# Patient Record
Sex: Male | Born: 1935 | Race: White | Hispanic: No | Marital: Married | Smoking: Never smoker
Health system: Southern US, Community
[De-identification: ages and names within clinical notes are randomized; demographics above are authoritative.]

## PROBLEM LIST (undated history)

## (undated) DIAGNOSIS — I219 Acute myocardial infarction, unspecified: Secondary | ICD-10-CM

## (undated) DIAGNOSIS — F01518 Vascular dementia, unspecified severity, with other behavioral disturbance: Secondary | ICD-10-CM

## (undated) DIAGNOSIS — I10 Essential (primary) hypertension: Secondary | ICD-10-CM

## (undated) DIAGNOSIS — E785 Hyperlipidemia, unspecified: Secondary | ICD-10-CM

## (undated) DIAGNOSIS — I2510 Atherosclerotic heart disease of native coronary artery without angina pectoris: Secondary | ICD-10-CM

## (undated) DIAGNOSIS — F0151 Vascular dementia with behavioral disturbance: Secondary | ICD-10-CM

## (undated) HISTORY — DX: Atherosclerotic heart disease of native coronary artery without angina pectoris: I25.10

## (undated) HISTORY — PX: OTHER SURGICAL HISTORY: SHX169

## (undated) HISTORY — DX: Essential (primary) hypertension: I10

## (undated) HISTORY — DX: Vascular dementia, unspecified severity, with other behavioral disturbance: F01.518

## (undated) HISTORY — DX: Vascular dementia with behavioral disturbance: F01.51

## (undated) HISTORY — DX: Hyperlipidemia, unspecified: E78.5

## (undated) HISTORY — DX: Acute myocardial infarction, unspecified: I21.9

## (undated) HISTORY — PX: CORONARY ARTERY BYPASS GRAFT: SHX141

---

## 1987-06-11 DIAGNOSIS — I219 Acute myocardial infarction, unspecified: Secondary | ICD-10-CM

## 1987-06-11 HISTORY — DX: Acute myocardial infarction, unspecified: I21.9

## 1988-06-10 HISTORY — PX: CARDIAC CATHETERIZATION: SHX172

## 1989-06-10 HISTORY — PX: CORONARY ARTERY BYPASS GRAFT: SHX141

## 2007-06-11 HISTORY — PX: CORONARY ANGIOPLASTY: SHX604

## 2010-12-31 ENCOUNTER — Ambulatory Visit: Payer: Self-pay | Admitting: Cardiovascular Disease

## 2011-01-11 ENCOUNTER — Ambulatory Visit: Payer: Self-pay | Admitting: Cardiovascular Disease

## 2011-01-11 ENCOUNTER — Encounter: Payer: Self-pay | Admitting: Cardiovascular Disease

## 2011-01-11 ENCOUNTER — Ambulatory Visit (INDEPENDENT_AMBULATORY_CARE_PROVIDER_SITE_OTHER): Payer: Medicare Other | Admitting: Cardiovascular Disease

## 2011-01-11 DIAGNOSIS — Z951 Presence of aortocoronary bypass graft: Secondary | ICD-10-CM

## 2011-01-11 DIAGNOSIS — F0390 Unspecified dementia without behavioral disturbance: Secondary | ICD-10-CM | POA: Insufficient documentation

## 2011-01-11 DIAGNOSIS — E119 Type 2 diabetes mellitus without complications: Secondary | ICD-10-CM | POA: Insufficient documentation

## 2011-01-11 DIAGNOSIS — E785 Hyperlipidemia, unspecified: Secondary | ICD-10-CM | POA: Insufficient documentation

## 2011-01-11 DIAGNOSIS — I10 Essential (primary) hypertension: Secondary | ICD-10-CM | POA: Insufficient documentation

## 2011-01-11 NOTE — Assessment & Plan Note (Signed)
Currently with no symptoms of angina. No further workup at this time. Continue current medication regimen. 

## 2011-01-11 NOTE — Assessment & Plan Note (Signed)
We'll try to obtain his most recent cholesterol panel from the records

## 2011-01-11 NOTE — Patient Instructions (Signed)
You are doing well. No medication changes were made. Please call us if you have new issues that need to be addressed before your next appt.  We will call you for a follow up Appt. In 6 months  

## 2011-01-11 NOTE — Assessment & Plan Note (Signed)
Blood pressure was initially elevated but did improve on recheck. We will continue to monitor his blood pressure. No medication changes made.

## 2011-01-11 NOTE — Progress Notes (Signed)
Patient ID: Derrick Brooks, male    DOB: December 06, 1927, 77 y.o.   MRN: [id number]  HPI Comments: Derrick Brooks is an 77 year old gentleman with hypertension, diabetes, coronary artery disease with several stents placed to his distal RCA, bypass surgery in 1990, stents placed in 2009 in [place] who presents to establish care. He presents with his daughter. The patient has underlying vascular dementia and most of the history comes from his daughter.  She reports that he has been doing well over the past several years. He is moving to [place] to be closer to family. He has not yet established with a primary care physician. His been tolerating his medications well. He is able to ambulate and has no falls. He does have skin lesions that appear to be like pustules or lipid collections on his head and chest that had been there chronically.   He is married, stays busy around the house. No symptoms of chest discomfort or shortness of breath.  Notes indicate promus stent x2 to the PLV and proximal PDA. PLV stent is 3.0 x 18 mm, proximal PDA stent is 3.0 x 23 mm. Second stent is a liberte stent placed to the proximal PDA that is 4 mm x 16 mm  EKG shows normal sinus rhythm with rate 67 beats per minute with frequent APCs and a bigeminal pattern, left bundle branch block   Outpatient Encounter Prescriptions as of 01/11/2011  Medication Sig Dispense Refill  . aspirin 81 MG tablet Take 81 mg by mouth daily.        Marland Kitchen atorvastatin (LIPITOR) 20 MG tablet Take 20 mg by mouth daily.        . clopidogrel (PLAVIX) 75 MG tablet Take 75 mg by mouth daily.        . fish oil-omega-3 fatty acids 1000 MG capsule Take 1 g by mouth daily.        . quinapril (ACCUPRIL) 20 MG tablet Take 20 mg by mouth at bedtime.        . sitaGLIPtin (JANUVIA) 100 MG tablet Take 100 mg by mouth daily.        . vitamin B-12 (CYANOCOBALAMIN) 50 MCG tablet Take 50 mcg by mouth daily.           Review of Systems    Constitutional: Negative.   HENT: Negative.   Eyes: Negative.   Respiratory: Negative.   Cardiovascular: Negative.   Gastrointestinal: Negative.   Musculoskeletal: Negative.   Skin: Negative.   Neurological: Negative.   Hematological: Negative.   Psychiatric/Behavioral: Negative.   All other systems reviewed and are negative.    BP 135/75  Pulse 64  Resp 16  Ht 5\' 10"  (1.778 m)  Wt 171 lb (77.565 kg)  BMI 24.54 kg/m2  Physical Exam  Nursing note and vitals reviewed. Constitutional: He is oriented to person, place, and time. He appears well-developed and well-nourished.  HENT:  Head: Normocephalic.  Nose: Nose normal.  Mouth/Throat: Oropharynx is clear and moist.  Eyes: Conjunctivae are normal. Pupils are equal, round, and reactive to light.  Neck: Normal range of motion. Neck supple. No JVD present.  Cardiovascular: Normal rate, regular rhythm, S1 normal, S2 normal, normal heart sounds and intact distal pulses.  Exam reveals no gallop and no friction rub.   No murmur heard. Pulmonary/Chest: Effort normal and breath sounds normal. No respiratory distress. He has no wheezes. He has no rales. He exhibits no tenderness.  Abdominal: Soft. Bowel sounds are normal. He exhibits no  distension. There is no tenderness.  Musculoskeletal: Normal range of motion. He exhibits no edema and no tenderness.  Lymphadenopathy:    He has no cervical adenopathy.  Neurological: He is alert and oriented to person, place, and time. Coordination normal.  Skin: Skin is warm and dry. No rash noted. No erythema.       Cystic-looking lesions on his head, chest  Psychiatric: He has a normal mood and affect.       Tangential thought, not a very good historian, poor memory    Assessment and Plan

## 2011-01-11 NOTE — Assessment & Plan Note (Signed)
We will try to obtain his most recent lab work to ensure adequate diabetes control

## 2011-01-11 NOTE — Assessment & Plan Note (Signed)
Daughter reports underlying vascular dementia. Continue on aspirin and statin

## 2011-04-28 ENCOUNTER — Emergency Department: Payer: Medicare Other | Admitting: Emergency Medicine

## 2011-05-24 ENCOUNTER — Ambulatory Visit (INDEPENDENT_AMBULATORY_CARE_PROVIDER_SITE_OTHER): Payer: Medicare Other | Admitting: Internal Medicine

## 2011-05-24 ENCOUNTER — Encounter: Payer: Self-pay | Admitting: Internal Medicine

## 2011-05-24 DIAGNOSIS — E119 Type 2 diabetes mellitus without complications: Secondary | ICD-10-CM

## 2011-05-24 DIAGNOSIS — O24410 Gestational diabetes mellitus in pregnancy, diet controlled: Secondary | ICD-10-CM

## 2011-05-24 DIAGNOSIS — F0151 Vascular dementia with behavioral disturbance: Secondary | ICD-10-CM

## 2011-05-24 DIAGNOSIS — I10 Essential (primary) hypertension: Secondary | ICD-10-CM

## 2011-05-24 DIAGNOSIS — F0150 Vascular dementia without behavioral disturbance: Secondary | ICD-10-CM

## 2011-05-24 DIAGNOSIS — E785 Hyperlipidemia, unspecified: Secondary | ICD-10-CM

## 2011-05-24 NOTE — Progress Notes (Signed)
Subjective:    Patient ID: Derrick Brooks, male    DOB: [birth year], 77 y.o.   MRN: [id number]  HPI  Mr. Imanol is an 77 yo male with a history of vascular dementia with behavoral disturbances, diet controlled diabetes,  diagnosed at age 82, brought in by his daughter who is primary caregiver, to establish primary care.  His diabetes was previously treated with Januvia and metformin, now diet controlled since he lost approximately 100 lbs..  He has CKD, prior history of obesity, and of CAD.  He is s/p AMI, s/p CABG x 1 1990. His second AMI occurred in 2009 s/p PTCA x 3. His behavioral disturbances are managed with supplements of coconut oil and phosphatidyl serine that his son, an MD has designed for him.   ,     Review of Systems  Constitutional: Negative for fever, chills, diaphoresis, activity change, appetite change, fatigue and unexpected weight change.  HENT: Negative for hearing loss, ear pain, nosebleeds, congestion, sore throat, facial swelling, rhinorrhea, sneezing, drooling, mouth sores, trouble swallowing, neck pain, neck stiffness, dental problem, voice change, postnasal drip, sinus pressure, tinnitus and ear discharge.   Eyes: Negative for photophobia, pain, discharge, redness, itching and visual disturbance.  Respiratory: Negative for apnea, cough, choking, chest tightness, shortness of breath, wheezing and stridor.   Cardiovascular: Negative for chest pain, palpitations and leg swelling.  Gastrointestinal: Negative for nausea, vomiting, abdominal pain, diarrhea, constipation, blood in stool, abdominal distention, anal bleeding and rectal pain.  Genitourinary: Negative for dysuria, urgency, frequency, hematuria, flank pain, decreased urine volume, scrotal swelling, difficulty urinating and testicular pain.  Musculoskeletal: Negative for myalgias, back pain, joint swelling, arthralgias and gait problem.  Skin: Negative for color change, rash and wound.  Neurological: Negative for  dizziness, tremors, seizures, syncope, speech difficulty, weakness, light-headedness, numbness and headaches.  Psychiatric/Behavioral: Negative for suicidal ideas, hallucinations, behavioral problems, confusion, sleep disturbance, dysphoric mood, decreased concentration and agitation. The patient is not nervous/anxious.   BP 142/54  Pulse 57  Temp(Src) 97.6 F (36.4 C) (Oral)  Ht 5\' 8"  (1.727 m)  Wt 167 lb (75.751 kg)  BMI 25.39 kg/m2  SpO2 99%     Objective:   Physical Exam  HENT:  Head: Normocephalic and atraumatic.  Mouth/Throat: Oropharynx is clear and moist.  Eyes: Conjunctivae and EOM are normal.  Neck: Normal range of motion. Neck supple. No JVD present. No thyromegaly present.  Cardiovascular: Normal rate, regular rhythm and normal heart sounds.   Pulmonary/Chest: Effort normal and breath sounds normal. He has no wheezes. He has no rales.  Abdominal: Soft. Bowel sounds are normal. He exhibits no mass. There is no tenderness. There is no rebound.  Musculoskeletal: Normal range of motion. He exhibits no edema.  Neurological: He is alert.  Skin: Skin is warm and dry.  Psychiatric: His speech is normal. He is is hyperactive. Thought content is delusional. Cognition and memory are impaired. He expresses impulsivity.      Assessment & Plan:   Gestational diabetes mellitus, diet-controlled hgba1c is 6.3.  No medications needed  Hyperlipidemia Managed with lipitor.  Patient follows up with cardiologist every 6 months per family preference  Vascular dementia with behavioral disturbance Managed with supplements b daughter and son.  End of life issues discussed with patient's daughter and wife and DNR forms given.   Hypertension Managed with quinopril.    Updated Medication List Outpatient Encounter Prescriptions as of 05/24/2011  Medication Sig Dispense Refill  . aspirin 81 MG tablet Take 81 mg  by mouth daily.        Marland Kitchen atorvastatin (LIPITOR) 20 MG tablet Take 20 mg by  mouth daily.        . clopidogrel (PLAVIX) 75 MG tablet Take 75 mg by mouth daily.        . fish oil-omega-3 fatty acids 1000 MG capsule Take 2 g by mouth daily.        . quinapril (ACCUPRIL) 20 MG tablet Take 20 mg by mouth 2 (two) times daily.        . vitamin B-12 (CYANOCOBALAMIN) 1000 MCG tablet Take 1,000 mcg by mouth daily.        . vitamin C (ASCORBIC ACID) 500 MG tablet Take 500 mg by mouth daily.

## 2011-05-26 ENCOUNTER — Encounter: Payer: Self-pay | Admitting: Internal Medicine

## 2011-05-26 DIAGNOSIS — E785 Hyperlipidemia, unspecified: Secondary | ICD-10-CM | POA: Insufficient documentation

## 2011-05-26 DIAGNOSIS — F0151 Vascular dementia with behavioral disturbance: Secondary | ICD-10-CM | POA: Insufficient documentation

## 2011-05-26 DIAGNOSIS — I10 Essential (primary) hypertension: Secondary | ICD-10-CM | POA: Insufficient documentation

## 2011-05-26 DIAGNOSIS — I2510 Atherosclerotic heart disease of native coronary artery without angina pectoris: Secondary | ICD-10-CM | POA: Insufficient documentation

## 2011-05-26 DIAGNOSIS — O24410 Gestational diabetes mellitus in pregnancy, diet controlled: Secondary | ICD-10-CM | POA: Insufficient documentation

## 2011-05-26 NOTE — Assessment & Plan Note (Signed)
Managed with lipitor.  Patient follows up with cardiologist every 6 months per family preference

## 2011-05-26 NOTE — Assessment & Plan Note (Addendum)
Managed with quinopril.

## 2011-05-26 NOTE — Assessment & Plan Note (Signed)
hgba1c is 6.3.  No medications needed

## 2011-05-26 NOTE — Assessment & Plan Note (Signed)
Managed with supplements b daughter and son.  End of life issues discussed with patient's daughter and wife and DNR forms given.

## 2011-05-27 ENCOUNTER — Encounter: Payer: Self-pay | Admitting: Cardiovascular Disease

## 2011-06-07 ENCOUNTER — Telehealth: Payer: Self-pay | Admitting: Internal Medicine

## 2011-06-07 MED ORDER — ATORVASTATIN CALCIUM 20 MG PO TABS: 20.0000 mg | ORAL_TABLET | Freq: Every day | ORAL | Status: AC

## 2011-06-07 NOTE — Telephone Encounter (Signed)
Request for refill on lipitor. Sent to [facility] as requested.

## 2011-06-11 ENCOUNTER — Ambulatory Visit: Payer: Self-pay | Admitting: Internal Medicine

## 2011-06-24 ENCOUNTER — Telehealth: Payer: Self-pay | Admitting: *Deleted

## 2011-06-24 LAB — COMPREHENSIVE METABOLIC PANEL
Anion Gap: 10 (ref 7–16)
BUN: 32 mg/dL — ABNORMAL HIGH (ref 7–18)
Bilirubin,Total: 0.2 mg/dL (ref 0.2–1.0)
Calcium, Total: 9 mg/dL (ref 8.5–10.1)
Creatinine: 1.5 mg/dL — ABNORMAL HIGH (ref 0.60–1.30)
EGFR (African American): 58 — ABNORMAL LOW
Glucose: 129 mg/dL — ABNORMAL HIGH (ref 65–99)
Osmolality: 299 (ref 275–301)
SGPT (ALT): 37 U/L
Sodium: 146 mmol/L — ABNORMAL HIGH (ref 136–145)
Total Protein: 6.2 g/dL — ABNORMAL LOW (ref 6.4–8.2)

## 2011-06-24 LAB — CBC
HCT: 35 % — ABNORMAL LOW (ref 40.0–52.0)
HGB: 11.5 g/dL — ABNORMAL LOW (ref 13.0–18.0)
MCV: 94 fL (ref 80–100)
RBC: 3.74 10*6/uL — ABNORMAL LOW (ref 4.40–5.90)
WBC: 9.6 10*3/uL (ref 3.8–10.6)

## 2011-06-24 NOTE — Telephone Encounter (Signed)
Derrick Brooks has faxed a form that pt needs to go to [employer] at [facility].  Form is in your red folder.

## 2011-06-25 ENCOUNTER — Inpatient Hospital Stay: Payer: Self-pay | Admitting: Internal Medicine

## 2011-06-25 ENCOUNTER — Telehealth: Payer: Self-pay | Admitting: *Deleted

## 2011-06-25 LAB — URINALYSIS, COMPLETE
Bilirubin,UR: NEGATIVE
Blood: NEGATIVE
Glucose,UR: NEGATIVE mg/dL (ref 0–75)
Ketone: NEGATIVE
Nitrite: NEGATIVE
Protein: 30
RBC,UR: 1 /HPF (ref 0–5)
WBC UR: 8 /HPF (ref 0–5)

## 2011-06-25 LAB — TROPONIN I
Troponin-I: 0.07 ng/mL — ABNORMAL HIGH
Troponin-I: 0.04 ng/mL

## 2011-06-25 LAB — BASIC METABOLIC PANEL
Anion Gap: 12 (ref 7–16)
BUN: 30 mg/dL — ABNORMAL HIGH (ref 7–18)
Calcium, Total: 8.8 mg/dL (ref 8.5–10.1)
Co2: 27 mmol/L (ref 21–32)
EGFR (African American): 59 — ABNORMAL LOW
Glucose: 103 mg/dL — ABNORMAL HIGH (ref 65–99)
Osmolality: 303 (ref 275–301)
Sodium: 149 mmol/L — ABNORMAL HIGH (ref 136–145)

## 2011-06-25 LAB — DRUG SCREEN, URINE
Amphetamines, Ur Screen: NEGATIVE (ref ?–1000)
Barbiturates, Ur Screen: NEGATIVE (ref ?–200)
Benzodiazepine, Ur Scrn: NEGATIVE (ref ?–200)
Cannabinoid 50 Ng, Ur ~~LOC~~: NEGATIVE (ref ?–50)
Methadone, Ur Screen: NEGATIVE (ref ?–300)
Opiate, Ur Screen: NEGATIVE (ref ?–300)
Phencyclidine (PCP) Ur S: NEGATIVE (ref ?–25)

## 2011-06-25 NOTE — Telephone Encounter (Signed)
Advised pt's daughter, follow up appt made for next week.

## 2011-06-25 NOTE — Telephone Encounter (Signed)
No, please let her know that I have decided to let the doctors at the hospital take care of my patients while at the hospital, I will see him when he gets out.

## 2011-06-25 NOTE — Telephone Encounter (Signed)
Fax from call a nurse is in your red folder.  Pt went to ED with seizures.

## 2011-06-25 NOTE — Telephone Encounter (Signed)
Form faxed to [facility].

## 2011-06-25 NOTE — Telephone Encounter (Signed)
Pt's daughter called and said that pt has been admitted to [facility]. She says the doctors there are concerned about his BP being high.  Daughter is asking if you will be able to see him at the hospital.

## 2011-06-26 LAB — LIPID PANEL
HDL Cholesterol: 75 mg/dL — ABNORMAL HIGH (ref 40–60)
VLDL Cholesterol, Calc: 14 mg/dL (ref 5–40)

## 2011-06-26 LAB — BASIC METABOLIC PANEL
Calcium, Total: 8.5 mg/dL (ref 8.5–10.1)
Chloride: 108 mmol/L — ABNORMAL HIGH (ref 98–107)
Co2: 27 mmol/L (ref 21–32)
Osmolality: 295 (ref 275–301)
Potassium: 3.9 mmol/L (ref 3.5–5.1)

## 2011-06-27 LAB — BASIC METABOLIC PANEL
BUN: 22 mg/dL — ABNORMAL HIGH (ref 7–18)
Chloride: 108 mmol/L — ABNORMAL HIGH (ref 98–107)
Creatinine: 1.37 mg/dL — ABNORMAL HIGH (ref 0.60–1.30)
EGFR (African American): >60
EGFR (Non-African Amer.): 53 — ABNORMAL LOW
Glucose: 92 mg/dL (ref 65–99)
Osmolality: 295 (ref 275–301)
Potassium: 4 mmol/L (ref 3.5–5.1)
Sodium: 147 mmol/L — ABNORMAL HIGH (ref 136–145)

## 2011-06-28 ENCOUNTER — Inpatient Hospital Stay: Payer: Self-pay | Admitting: Student

## 2011-06-28 DIAGNOSIS — I4891 Unspecified atrial fibrillation: Secondary | ICD-10-CM

## 2011-06-28 DIAGNOSIS — I059 Rheumatic mitral valve disease, unspecified: Secondary | ICD-10-CM

## 2011-06-28 DIAGNOSIS — R079 Chest pain, unspecified: Secondary | ICD-10-CM

## 2011-06-28 LAB — CBC WITH DIFFERENTIAL/PLATELET
Basophil #: 0 10*3/uL (ref 0.0–0.1)
Basophil %: 0.3 %
Eosinophil %: 2.5 %
HCT: 36.7 % — ABNORMAL LOW (ref 40.0–52.0)
Lymphocyte #: 2.3 10*3/uL (ref 1.0–3.6)
Lymphocyte %: 26.1 %
MCHC: 32.9 g/dL (ref 32.0–36.0)
MCV: 94 fL (ref 80–100)
Monocyte #: 0.7 10*3/uL (ref 0.0–0.7)
Monocyte %: 8 %
Neutrophil %: 63.1 %
Platelet: 198 10*3/uL (ref 150–440)
RDW: 13.8 % (ref 11.5–14.5)
WBC: 9 10*3/uL (ref 3.8–10.6)

## 2011-06-28 LAB — COMPREHENSIVE METABOLIC PANEL
Albumin: 3.2 g/dL — ABNORMAL LOW (ref 3.4–5.0)
Anion Gap: 15 (ref 7–16)
BUN: 24 mg/dL — ABNORMAL HIGH (ref 7–18)
Bilirubin,Total: 0.3 mg/dL (ref 0.2–1.0)
Chloride: 105 mmol/L (ref 98–107)
Creatinine: 1.45 mg/dL — ABNORMAL HIGH (ref 0.60–1.30)
EGFR (African American): >60
Glucose: 142 mg/dL — ABNORMAL HIGH (ref 65–99)
Osmolality: 293 (ref 275–301)
Potassium: 3.7 mmol/L (ref 3.5–5.1)
SGOT(AST): 21 U/L (ref 15–37)
Sodium: 144 mmol/L (ref 136–145)
Total Protein: 5.9 g/dL — ABNORMAL LOW (ref 6.4–8.2)

## 2011-06-28 LAB — URINALYSIS, COMPLETE
Bacteria: NONE SEEN
Bilirubin,UR: NEGATIVE
Blood: NEGATIVE
Glucose,UR: NEGATIVE mg/dL (ref 0–75)
Ketone: NEGATIVE
Leukocyte Esterase: NEGATIVE
Ph: 5 (ref 4.5–8.0)
Specific Gravity: 1.011 (ref 1.003–1.030)
Squamous Epithelial: NONE SEEN

## 2011-06-28 LAB — CK TOTAL AND CKMB (NOT AT ARMC)
CK, Total: 53 U/L (ref 35–232)
CK-MB: 1.8 ng/mL (ref 0.5–3.6)
CK-MB: 1.8 ng/mL (ref 0.5–3.6)

## 2011-06-28 LAB — APTT
Activated PTT: 29 secs (ref 23.6–35.9)
Activated PTT: 108.9 secs — ABNORMAL HIGH (ref 23.6–35.9)

## 2011-06-28 LAB — TROPONIN I
Troponin-I: 0.03 ng/mL
Troponin-I: 0.11 ng/mL — ABNORMAL HIGH

## 2011-06-29 LAB — BASIC METABOLIC PANEL
Anion Gap: 14 (ref 7–16)
BUN: 20 mg/dL — ABNORMAL HIGH (ref 7–18)
Calcium, Total: 8.6 mg/dL (ref 8.5–10.1)
Co2: 23 mmol/L (ref 21–32)
Creatinine: 1.46 mg/dL — ABNORMAL HIGH (ref 0.60–1.30)
EGFR (African American): 60 — ABNORMAL LOW
EGFR (Non-African Amer.): 49 — ABNORMAL LOW
Glucose: 104 mg/dL — ABNORMAL HIGH (ref 65–99)
Potassium: 3.6 mmol/L (ref 3.5–5.1)
Sodium: 148 mmol/L — ABNORMAL HIGH (ref 136–145)

## 2011-06-29 LAB — CBC WITH DIFFERENTIAL/PLATELET
Basophil #: 0 10*3/uL (ref 0.0–0.1)
Basophil %: 0.6 %
Eosinophil #: 0.3 10*3/uL (ref 0.0–0.7)
HCT: 32.1 % — ABNORMAL LOW (ref 40.0–52.0)
Lymphocyte %: 32.8 %
MCH: 31 pg (ref 26.0–34.0)
MCHC: 33.1 g/dL (ref 32.0–36.0)
Monocyte %: 7.9 %
Neutrophil %: 55.3 %
Platelet: 188 10*3/uL (ref 150–440)

## 2011-06-29 LAB — MAGNESIUM: Magnesium: 1.7 mg/dL — ABNORMAL LOW

## 2011-06-30 LAB — MAGNESIUM: Magnesium: 1.7 mg/dL — ABNORMAL LOW

## 2011-06-30 LAB — URINE CULTURE

## 2011-06-30 LAB — BASIC METABOLIC PANEL
BUN: 15 mg/dL (ref 7–18)
Chloride: 112 mmol/L — ABNORMAL HIGH (ref 98–107)
Creatinine: 1.4 mg/dL — ABNORMAL HIGH (ref 0.60–1.30)
EGFR (Non-African Amer.): 52 — ABNORMAL LOW
Potassium: 3.6 mmol/L (ref 3.5–5.1)

## 2011-07-01 LAB — BASIC METABOLIC PANEL
Calcium, Total: 8.4 mg/dL — ABNORMAL LOW (ref 8.5–10.1)
Chloride: 111 mmol/L — ABNORMAL HIGH (ref 98–107)
Co2: 22 mmol/L (ref 21–32)
Creatinine: 1.35 mg/dL — ABNORMAL HIGH (ref 0.60–1.30)
Osmolality: 291 (ref 275–301)
Potassium: 3.6 mmol/L (ref 3.5–5.1)

## 2011-07-03 ENCOUNTER — Ambulatory Visit: Payer: Self-pay | Admitting: Internal Medicine

## 2011-07-03 ENCOUNTER — Telehealth: Payer: Self-pay | Admitting: *Deleted

## 2011-07-03 NOTE — Telephone Encounter (Signed)
Debra at [facility] called asking for verbal orders for pt to have skilled nursing care, PT, OT, and home health aid, since pt is now home from hospital.

## 2011-07-03 NOTE — Telephone Encounter (Signed)
Verbals orders for all of the aforemetioned services  authorized

## 2011-07-03 NOTE — Telephone Encounter (Signed)
Verbal ok given to Debra.

## 2011-07-08 ENCOUNTER — Encounter: Payer: Self-pay | Admitting: Cardiovascular Disease

## 2011-07-12 ENCOUNTER — Ambulatory Visit: Payer: Self-pay | Admitting: Internal Medicine

## 2011-07-19 ENCOUNTER — Telehealth: Payer: Self-pay | Admitting: Internal Medicine

## 2011-07-19 NOTE — Telephone Encounter (Signed)
I have not seen him since he was hospitalized and I am concerned that if he stops the Keppra his risk of seizures is increased.  Has she tried giving him Immodium? For the diarrhea? If she has and it has not helped  And she wants to stop the Keppra, she is takingn the risk t that he has a seizure

## 2011-07-19 NOTE — Telephone Encounter (Signed)
Yes she can if she is able to start an IC .  She will need an order to start an IV and give NS   150 ml/hr for one liter.  Can she also check a BMET and magnesium level?

## 2011-07-19 NOTE — Telephone Encounter (Signed)
Pt's daughter states pt has had watery diarrhea and constant drippy runny nose for 3-4 weeks, since being on keppra.  He has decreased appetite and just wanted to sleep all day yesterday.  She is concerned that he is getting dehydrated but doesn't want to take him to ER for fluids.  She's asking if his home health nurse can give fluids at home.

## 2011-07-19 NOTE — Telephone Encounter (Signed)
Patient is having constant diarrhea,nausead no appetite,runny nose and drowsiness since he has been on Keppra.

## 2011-07-19 NOTE — Telephone Encounter (Signed)
I called [facility], they dont give fluids.  Daughter says she will continue to try and get the patient to drink gatorade, since she doesn't want to take him to the ER.  She is asking if he should continue the Keppra, since that is what she thinks is causing his symptoms.

## 2011-07-19 NOTE — Telephone Encounter (Signed)
Left message on voice mail advising daughter of instructions.  Suggested she call the nurse line this weekend if any problems.

## 2011-07-26 ENCOUNTER — Encounter: Payer: Self-pay | Admitting: Internal Medicine

## 2011-07-26 ENCOUNTER — Ambulatory Visit (INDEPENDENT_AMBULATORY_CARE_PROVIDER_SITE_OTHER): Payer: Medicare Other | Admitting: Internal Medicine

## 2011-07-26 DIAGNOSIS — I4891 Unspecified atrial fibrillation: Secondary | ICD-10-CM

## 2011-07-26 DIAGNOSIS — F0150 Vascular dementia without behavioral disturbance: Secondary | ICD-10-CM

## 2011-07-26 DIAGNOSIS — F0151 Vascular dementia with behavioral disturbance: Secondary | ICD-10-CM

## 2011-07-26 DIAGNOSIS — G40909 Epilepsy, unspecified, not intractable, without status epilepticus: Secondary | ICD-10-CM

## 2011-07-26 MED ORDER — HYDROCOD POLST-CHLORPHEN POLST 10-8 MG/5ML PO LQCR: 5.0000 mL | Freq: Two times a day (BID) | ORAL | Status: AC | PRN

## 2011-07-26 NOTE — Progress Notes (Signed)
Subjective:    Patient ID: Derrick Brooks, male    DOB: 10/28/1927, 78 y.o.   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 He was given a prescription for lamotrigine as an alternative but she has read about the side effects of Motrin and given his second admission for bradycardia was concerned that the lamotrigine may aggravate this. He has not had any additional seizure activity since discharge. He has been bradycardic with rate in the 50s and she has been suspending his amiodarone unless his heart rate is in the 80s. He did not appear to be in any distress today he is calm and not combative. She states that lately though he has been more agitated and combative and she has had to increase the amount of in the oil that she has been using to keep him calm.  Past Medical History  Diagnosis Date  . Vascular dementia   . Coronary artery disease     CABG & stent placement  . MI (myocardial infarction) 1989  . 3-vessel coronary artery disease   . Diabetes mellitus   . Hyperlipidemia   . Hypertension   . Vascular dementia with behavioral disturbance    Current Outpatient Prescriptions on File Prior to Visit  Medication Sig Dispense Refill  . amiodarone (PACERONE) 100 MG tablet Take 100 mg by mouth daily.      Marland Kitchen aspirin 81 MG tablet Take 81 mg by mouth daily.        Marland Kitchen atorvastatin (LIPITOR) 20 MG tablet Take 1 tablet (20 mg total) by mouth daily.  90 tablet  0  . clopidogrel (PLAVIX) 75 MG tablet Take 75  mg by mouth daily.        . fish oil-omega-3 fatty acids 1000 MG capsule Take 2 g by mouth daily.        Marland Kitchen levETIRAcetam (KEPPRA) 250 MG tablet Take 250 mg by mouth every 12 (twelve) hours.      . quinapril (ACCUPRIL) 20 MG tablet Take 20 mg by mouth 2 (two) times daily.        . sitaGLIPtin (JANUVIA) 100 MG tablet Take 100 mg by mouth daily.        . vitamin B-12 (CYANOCOBALAMIN) 1000 MCG tablet Take 1,000 mcg by mouth daily.        . vitamin C (ASCORBIC ACID) 500 MG tablet Take 250 mg by mouth daily.       . vitamin B-12 (CYANOCOBALAMIN) 50 MCG tablet Take 50 mcg by mouth daily.            Review of Systems  Constitutional: Negative for fever, chills, diaphoresis, activity change, appetite change, fatigue and unexpected weight change.  HENT: Negative for hearing loss, ear pain, nosebleeds, congestion, sore throat, facial swelling, rhinorrhea, sneezing, drooling, mouth sores, trouble swallowing, neck pain, neck stiffness, dental problem, voice change, postnasal drip, sinus pressure, tinnitus and ear discharge.   Eyes: Negative for photophobia, pain, discharge, redness, itching and visual disturbance.  Respiratory: Negative for apnea, cough, choking, chest tightness,  shortness of breath, wheezing and stridor.   Cardiovascular: Negative for chest pain, palpitations and leg swelling.  Gastrointestinal: Negative for nausea, vomiting, abdominal pain, diarrhea, constipation, blood in stool, abdominal distention, anal bleeding and rectal pain.  Genitourinary: Negative for dysuria, urgency, frequency, hematuria, flank pain, decreased urine volume, scrotal swelling, difficulty urinating and testicular pain.  Musculoskeletal: Negative for myalgias, back pain, joint swelling, arthralgias and gait problem.  Skin: Negative for color change, rash and wound.  Neurological: Negative for dizziness, tremors, seizures, syncope, speech difficulty, weakness, light-headedness, numbness and headaches.   Psychiatric/Behavioral: Negative for suicidal ideas, hallucinations, behavioral problems, confusion, sleep disturbance, dysphoric mood, decreased concentration and agitation. The patient is not nervous/anxious.        Objective:   Physical Exam  Constitutional: He is oriented to person, place, and time.  HENT:  Head: Normocephalic and atraumatic.  Mouth/Throat: Oropharynx is clear and moist.  Eyes: Conjunctivae and EOM are normal.  Neck: Normal range of motion. Neck supple. No JVD present. No thyromegaly present.  Cardiovascular: Normal rate, regular rhythm and normal heart sounds.   Pulmonary/Chest: Effort normal and breath sounds normal. He has no wheezes. He has no rales.  Abdominal: Soft. Bowel sounds are normal. He exhibits no mass. There is no tenderness. There is no rebound.  Musculoskeletal: Normal range of motion. He exhibits no edema.  Neurological: He is alert and oriented to person, place, and time.  Skin: Skin is warm and dry.  Psychiatric: His behavior is normal. His affect is blunt. His speech is tangential. Cognition and memory are impaired. He expresses inappropriate judgment.          Assessment & Plan:   Atrial fibrillation New-onset, requiring admission for rapid ventricular response followed by bradycardia. He was discharged home with instructions to resume any of the amiodarone at 100 mg daily for pulse greater than 50 however his daughter has wisely decided to withhold medication) is 82 so far this has been working well. He is not a candidate for Coumadin given his history of dementia and seizure disorder.  Vascular dementia with behavioral disturbance His images to the pelvis progressed to the point now her he is having seizures. Keppra lot caused recurrent diarrhea. Dilantin is not an option because of its propensity for causing bradycardia as well. We decided to proceed with Lamictal. Unfortunately these are tonic-clonic seizures and not SEIZURES SO THAT  PROSPECTIVE OF NOT TREATING HIM WITH TO BE VERY DISRUPTIVE TO BOTH PATIENT AND FAMILY. HE WILL RETURN IN ONE MONTH.    Updated Medication List Outpatient Encounter Prescriptions as of 07/26/2011  Medication Sig Dispense Refill  . amiodarone (PACERONE) 100 MG tablet Take 100 mg by mouth daily.      Marland Kitchen aspirin 81 MG tablet Take 81 mg by mouth daily.        Marland Kitchen atorvastatin (LIPITOR) 20 MG tablet Take 1 tablet (20 mg total) by mouth daily.  90 tablet  0  . clopidogrel (PLAVIX) 75 MG tablet Take 75 mg by mouth daily.        . fish oil-omega-3 fatty acids 1000 MG capsule Take 2 g by mouth daily.        Marland Kitchen levETIRAcetam (KEPPRA) 250 MG tablet Take 250 mg by mouth every 12 (twelve) hours.      . quinapril (ACCUPRIL) 20 MG tablet Take 20 mg by mouth 2 (two) times daily.        . sitaGLIPtin (JANUVIA) 100 MG tablet Take 100 mg by mouth daily.        Marland Kitchen  vitamin B-12 (CYANOCOBALAMIN) 1000 MCG tablet Take 1,000 mcg by mouth daily.        . vitamin C (ASCORBIC ACID) 500 MG tablet Take 250 mg by mouth daily.       . chlorpheniramine-HYDROcodone (TUSSIONEX PENNKINETIC ER) 10-8 MG/5ML LQCR Take 5 mLs by mouth every 12 (twelve) hours as needed.  230 mL  1  . vitamin B-12 (CYANOCOBALAMIN) 50 MCG tablet Take 50 mcg by mouth daily.

## 2011-07-28 ENCOUNTER — Encounter: Payer: Self-pay | Admitting: Internal Medicine

## 2011-07-28 DIAGNOSIS — I4891 Unspecified atrial fibrillation: Secondary | ICD-10-CM | POA: Insufficient documentation

## 2011-07-28 DIAGNOSIS — G40909 Epilepsy, unspecified, not intractable, without status epilepticus: Secondary | ICD-10-CM | POA: Insufficient documentation

## 2011-07-28 NOTE — Assessment & Plan Note (Signed)
New-onset, requiring admission for rapid ventricular response followed by bradycardia. He was discharged home with instructions to resume any of the amiodarone at 100 mg daily for pulse greater than 50 however his daughter has wisely decided to withhold medication) is 80 so far this has been working well. He is not a candidate for Coumadin given his history of dementia and seizure disorder.

## 2011-07-28 NOTE — Assessment & Plan Note (Signed)
His images to the pelvis progressed to the point now her he is having seizures. Keppra lot caused recurrent diarrhea. Dilantin is not an option because of its propensity for causing bradycardia as well. We decided to proceed with Lamictal. Unfortunately these are tonic-clonic seizures and not SEIZURES SO THAT PROSPECTIVE OF NOT TREATING HIM WITH TO BE VERY DISRUPTIVE TO BOTH PATIENT AND FAMILY. HE WILL RETURN IN ONE MONTH.

## 2011-08-01 ENCOUNTER — Other Ambulatory Visit: Payer: Self-pay | Admitting: Internal Medicine

## 2011-08-01 MED ORDER — CLOPIDOGREL BISULFATE 75 MG PO TABS: 75.0000 mg | ORAL_TABLET | Freq: Every day | ORAL | Status: AC

## 2011-08-01 MED ORDER — QUINAPRIL HCL 20 MG PO TABS: 20.0000 mg | ORAL_TABLET | Freq: Two times a day (BID) | ORAL | Status: AC

## 2011-08-01 NOTE — Telephone Encounter (Signed)
Daughter called in and would like a 3 month supply of the following medication called into [facility] on [address]. Quinapril 20 mg, plavix generic 75 mg, and keppra 250mg , she states she will be out of the keppra tonight.  She said that Dr. Darrick Huntsman had suggested an alternative to the keppra, but she would like to keep him on it.  Any questions please call her.

## 2011-08-01 NOTE — Telephone Encounter (Signed)
plavix and quinipril sent to pharmacy, waiting for Dr. Melina Schools approval on keppra.

## 2011-08-02 MED ORDER — LEVETIRACETAM 250 MG PO TABS: 250.0000 mg | ORAL_TABLET | Freq: Two times a day (BID) | ORAL | Status: AC

## 2011-08-13 ENCOUNTER — Telehealth: Payer: Self-pay | Admitting: Internal Medicine

## 2011-08-13 ENCOUNTER — Emergency Department: Payer: Self-pay | Admitting: Emergency Medicine

## 2011-08-13 LAB — URINALYSIS, COMPLETE
Bacteria: NONE SEEN
Bilirubin,UR: NEGATIVE
Blood: NEGATIVE
Glucose,UR: NEGATIVE mg/dL (ref 0–75)
Leukocyte Esterase: NEGATIVE
Nitrite: NEGATIVE
Ph: 5 (ref 4.5–8.0)
RBC,UR: 2 /HPF (ref 0–5)
Squamous Epithelial: 1
WBC UR: 1 /HPF (ref 0–5)

## 2011-08-13 NOTE — Telephone Encounter (Signed)
His cough may be from post nasal drip so have her try claritin or allegra daily (or at night ) for one week to see if it makes a difference.   Does she still want patient and spouse to be seen?  Shannon's message.Marland KitchenMarland Kitchen

## 2011-08-13 NOTE — Telephone Encounter (Signed)
Daughter states that she has been giving her dad robitussin since 07/26/11 and he is still no better wants patient and his spouse seen today.  She is also sick and wanted a new patient appt. I advised her that we didn't have any new patient appt, please advise where to put both patients.

## 2011-08-13 NOTE — Telephone Encounter (Signed)
Patients daughter stated patient has a non-productive cough that he can't seem to break up.  She stated she gives him the tussionex at night to help him sleep and he doesn't cough much then, but he can't seem to get rid of it.  She stated it is almost impossible to get him in the office so she doesn't think he can make an appt.  He is afebrile.  And cough is the only symptom he has.  She wanted to know what she needs to do.  Please advise.

## 2011-08-14 ENCOUNTER — Telehealth: Payer: Self-pay | Admitting: *Deleted

## 2011-08-14 DIAGNOSIS — F0390 Unspecified dementia without behavioral disturbance: Secondary | ICD-10-CM

## 2011-08-14 NOTE — Telephone Encounter (Signed)
[employer] [facility] (Daytime Triage) Fax: [phone] From: [facility] Date/ Time: 08/14/2011 11:17 AM Taken By: Yehuda Savannah, CSR Caller: Derrick Brooks Facility: not collected Patient: Derrick Brooks, Derrick Brooks DOB: 11-Jan-1928 Phone: [phone] Reason for Call: Derrick Brooks (Daugther) is returning a call from the office about Derrick Brooks (DOB: May 04, 1928) she wanted to know if he can go to [facility]. Call Tonda at: [phone] Regarding Appointment: Appt Date: Appt Time: Unknown Provider: Reason: Details: Outcome:

## 2011-08-14 NOTE — Telephone Encounter (Signed)
Patients daughter notified.  Patients spouse has an appt with Dr. Dan Humphreys tomorrow.

## 2011-08-15 LAB — URINE CULTURE

## 2011-08-15 NOTE — Telephone Encounter (Signed)
Hospice referral initiated in [facility]

## 2011-08-15 NOTE — Telephone Encounter (Signed)
Left message notifying patient that hospice referral has been placed and that we would be contacting her to help get set up.

## 2011-08-15 NOTE — Telephone Encounter (Signed)
Spoke with daughter, she says that she spoke with nurse from [facility] and she suggested hospice for the patient. He has been having so many problems with being impacted, and his dementia. Nurse from [facility] told daughter that he should qualify for hospice care. Daughter is asking for referral.

## 2011-08-30 ENCOUNTER — Ambulatory Visit: Payer: BC Managed Care – PPO | Admitting: Internal Medicine

## 2011-10-09 DEATH — deceased

## 2012-11-08 IMAGING — CT CT HEAD WITHOUT CONTRAST
2 series · 16 of 30 positions shown, 20 images · non-contrast
Comparison: none

REASON FOR EXAM: seizure
COMMENTS:   May transport without cardiac monitor

[Series 2: without · axial · non-contrast · 0.45mm/px · z∈[+396,+522]mm · 13 of 31 slices shown, 17 images]
[im 3/31  brain]
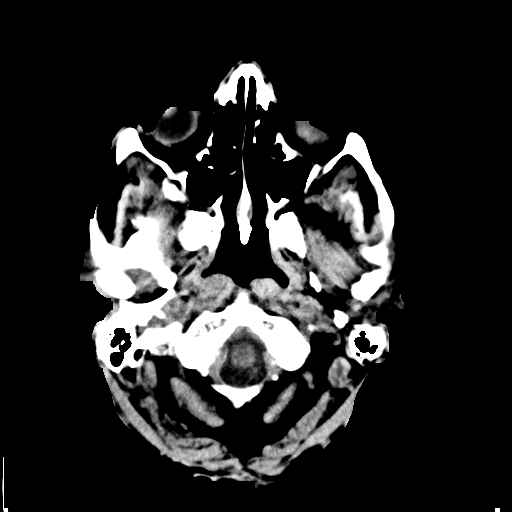
[im 3/31  bone]
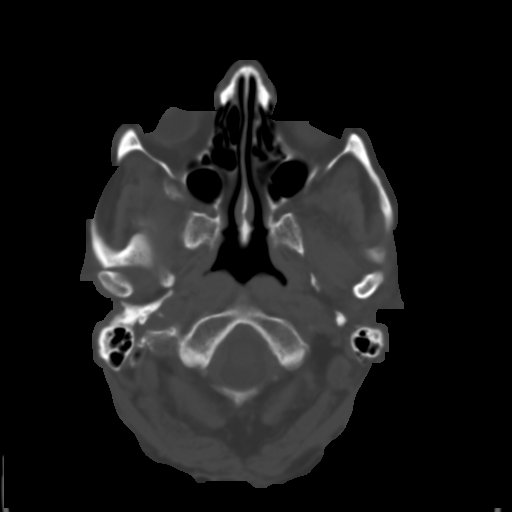
[im 5/31  brain]
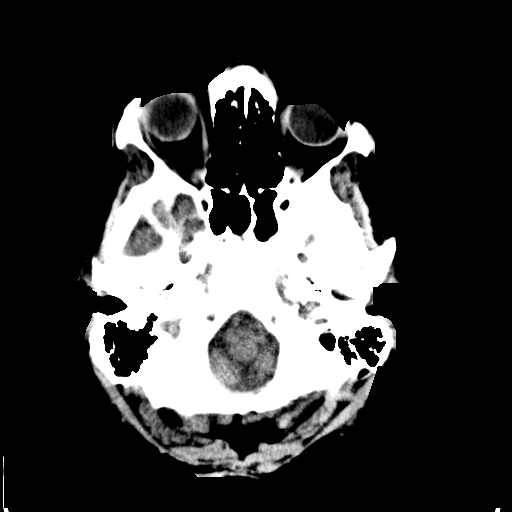
[im 7/31  brain]
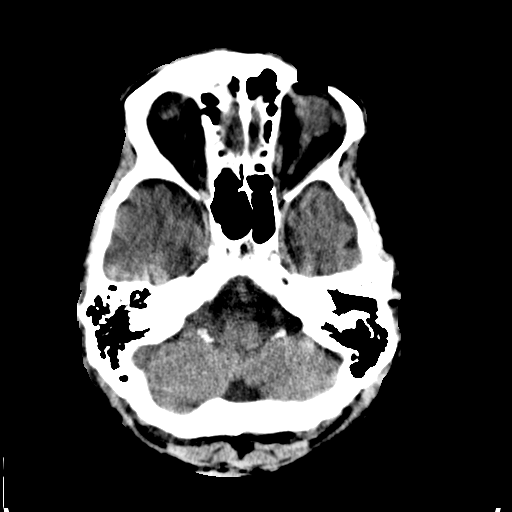
[im 9/31  brain]
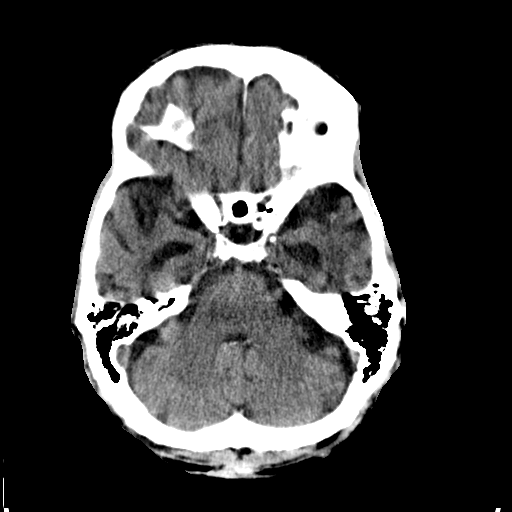
[im 11/31  brain]
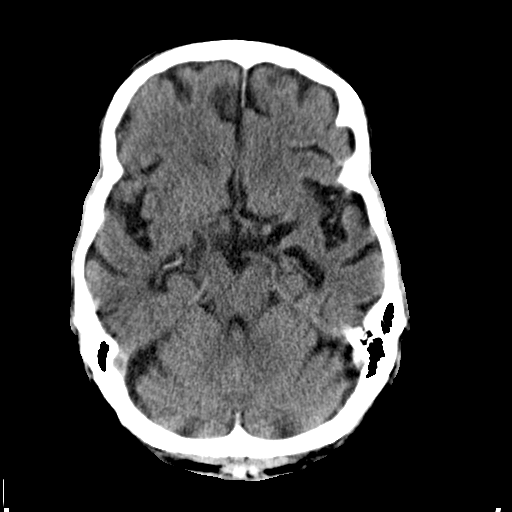
[im 11/31  bone]
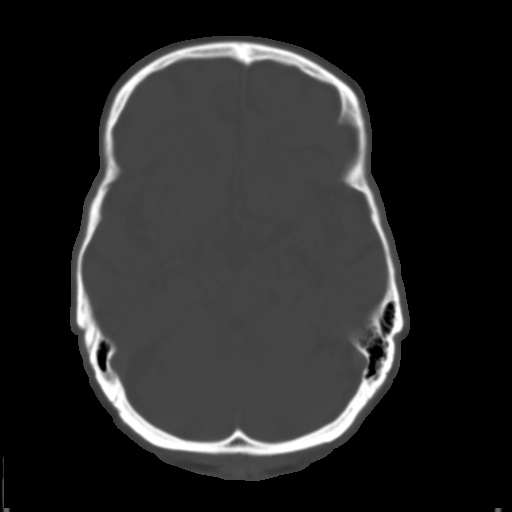
[im 13/31  brain]
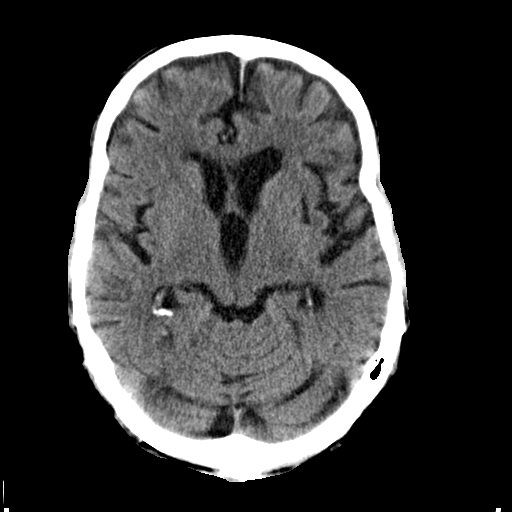
[im 16/31  brain]
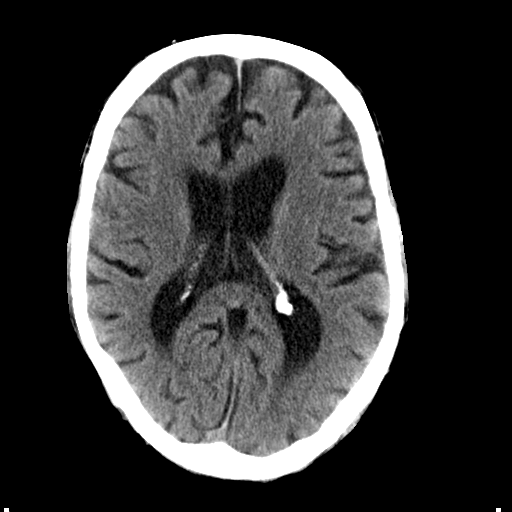
[im 18/31  brain]
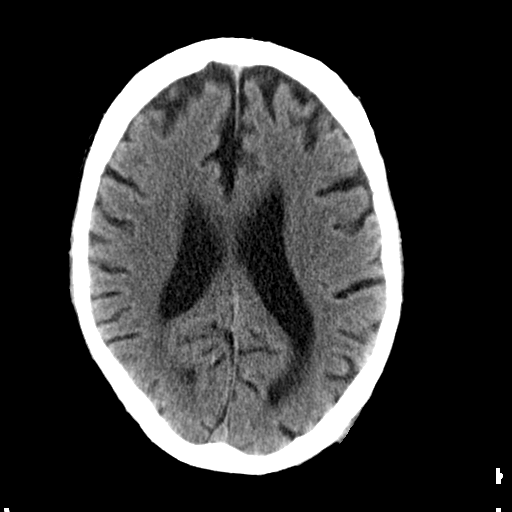
[im 20/31  brain]
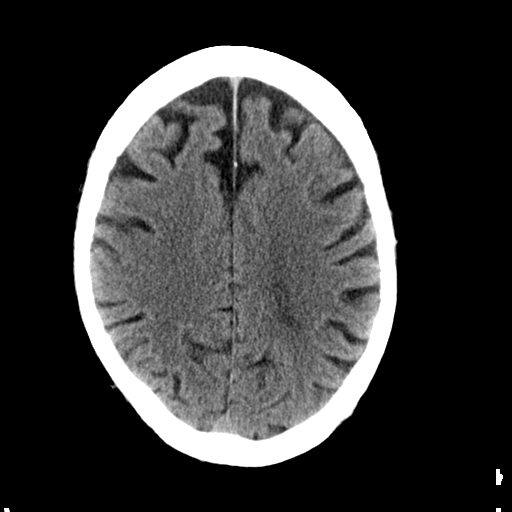
[im 20/31  bone]
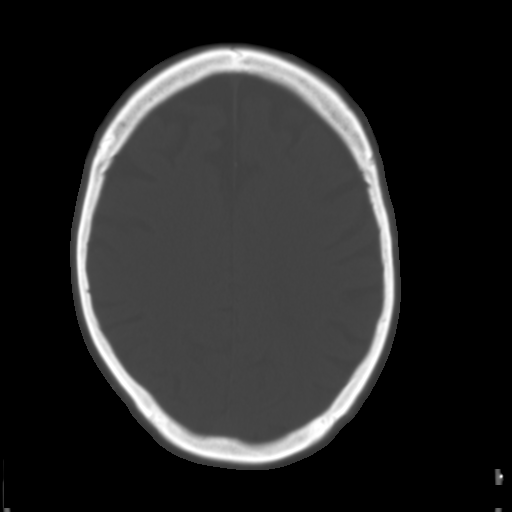
[im 22/31  brain]
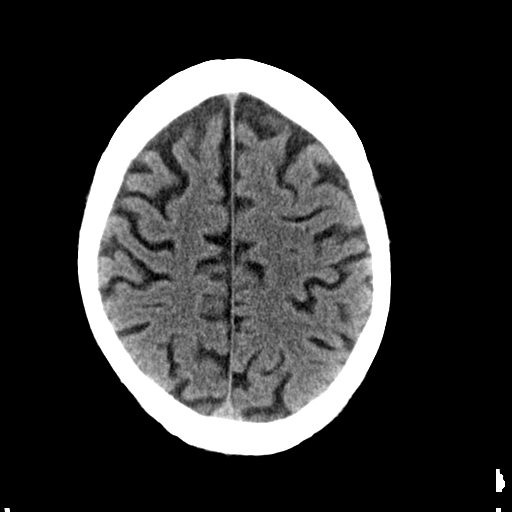
[im 24/31  brain]
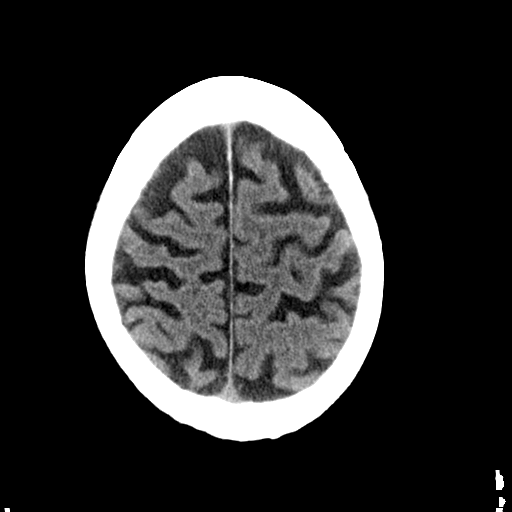
[im 26/31  brain]
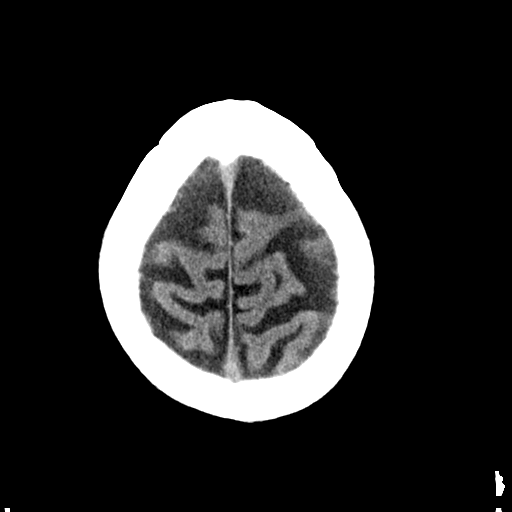
[im 28/31  brain]
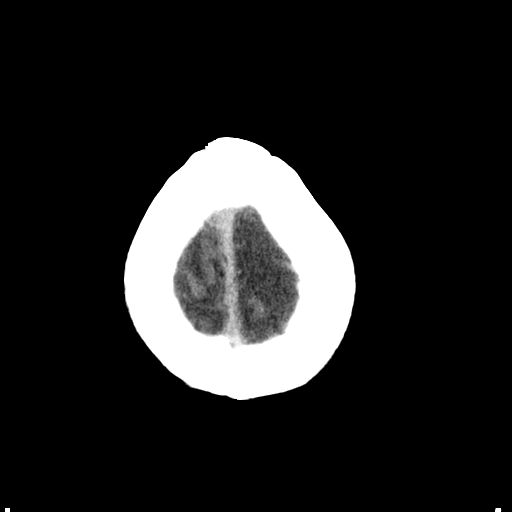
[im 28/31  bone]
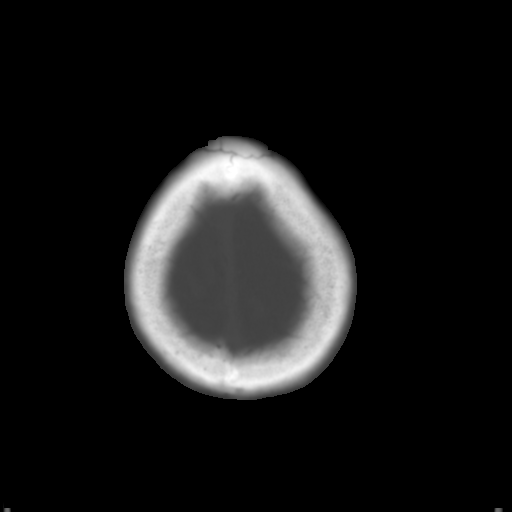

[Series 3: bone · axial · 0.45mm/px · z∈[+396,+436]mm · 3 of 31 slices shown]
[im 3/31  bone]
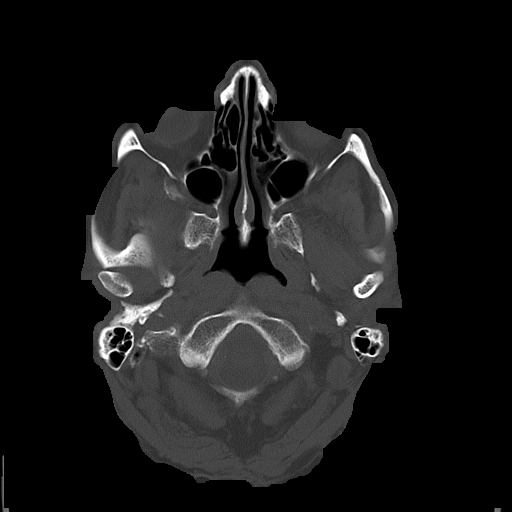
[im 7/31  bone]
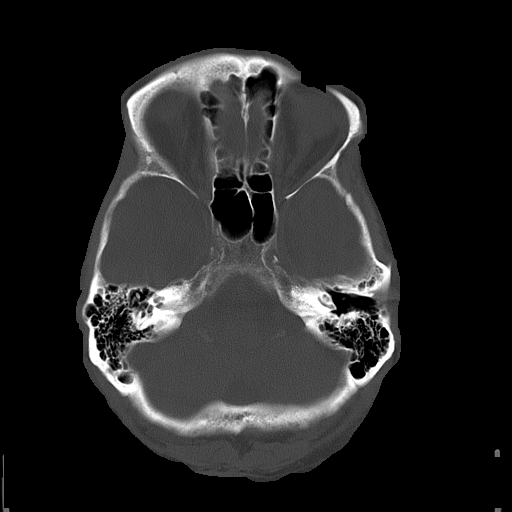
[im 11/31  bone]
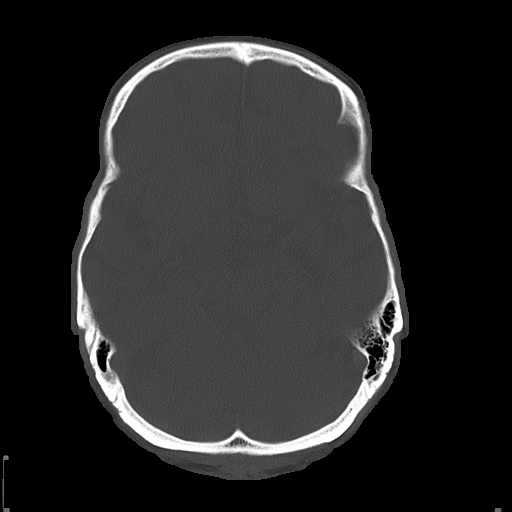

[16 of 30 positions shown; findings below may reference images not displayed]

PROCEDURE:     CT  - CT HEAD WITHOUT CONTRAST  - June 24, 2011  [DATE]

RESULT:     Emergent noncontrast CT of the brain is performed. The patient
has no previous exam for comparison.

There is prominence of the ventricles and sulci consistent with diffuse
atrophy. There is no evidence of intracranial hemorrhage, mass or mass
effect. No large territorial infarct is evident. Bilateral basal ganglia
lacunar infarcts are present. The included sinuses and mastoid air cells
show normal appearing aeration with noted a plasia of the frontal sinuses
bilaterally. The calvarium is intact.
IMPRESSION: 1. Findings consistent with diffuse atrophy and chronic basal ganglia
lacunar infarcts. No acute intracranial abnormality evident by noncontrast
CT.

## 2012-11-08 IMAGING — CR DG CHEST 1V PORT
1 series · 1 of 1 positions shown · non-contrast
Comparison: none

REASON FOR EXAM: new onset seizure
COMMENTS:

[portable]
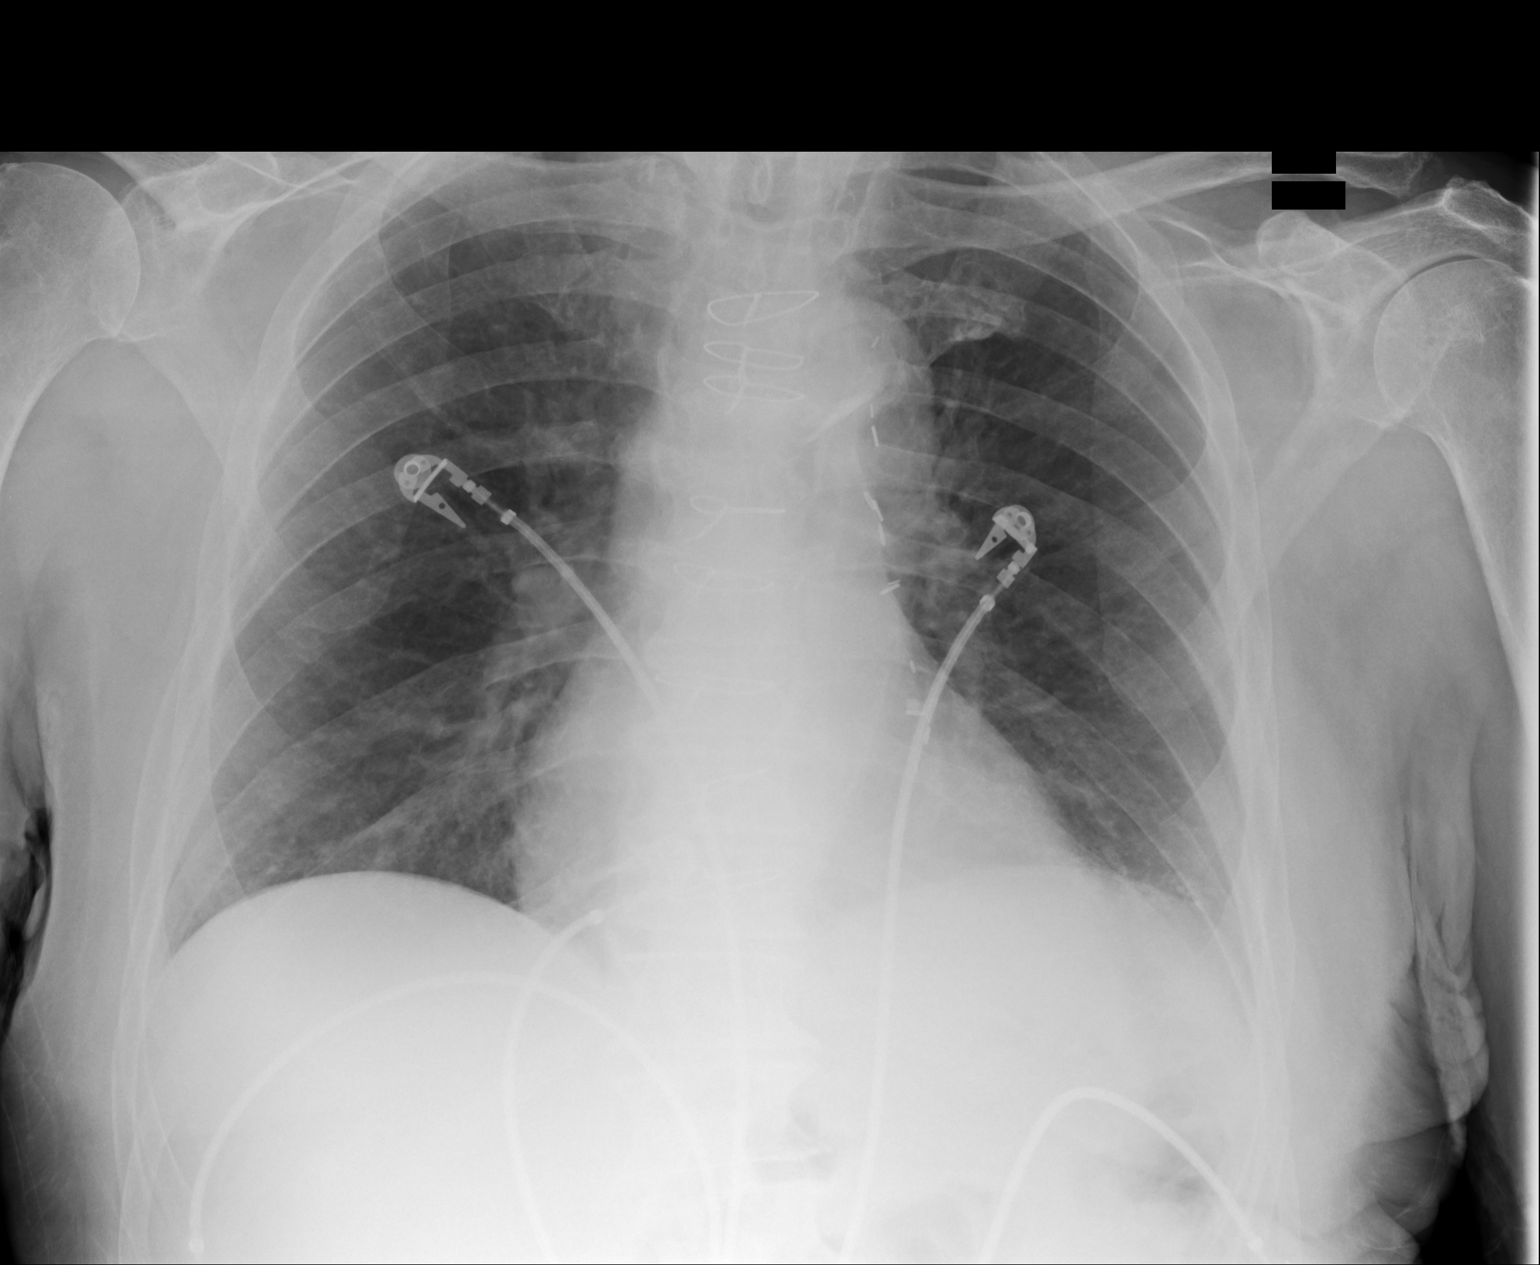

[1 of 1 positions shown; findings below may reference images not displayed]

PROCEDURE:     DXR - DXR PORTABLE CHEST SINGLE VIEW  - June 24, 2011  [DATE]

RESULT:     There is no previous exam for comparison.

The lungs are clear. The heart and pulmonary vessels are normal. The bony
and mediastinal structures are unremarkable. There is no effusion. There is
no pneumothorax or evidence of congestive failure.
IMPRESSION: No acute cardiopulmonary disease.

## 2012-11-12 IMAGING — CR DG CHEST 1V PORT
1 series · 1 of 1 positions shown · non-contrast
Comparison: none

REASON FOR EXAM: chest pain
COMMENTS:

PROCEDURE:     DXR - DXR PORTABLE CHEST SINGLE VIEW  - June 28, 2011  [DATE]
RESULT:     Comparison: None

[portable]
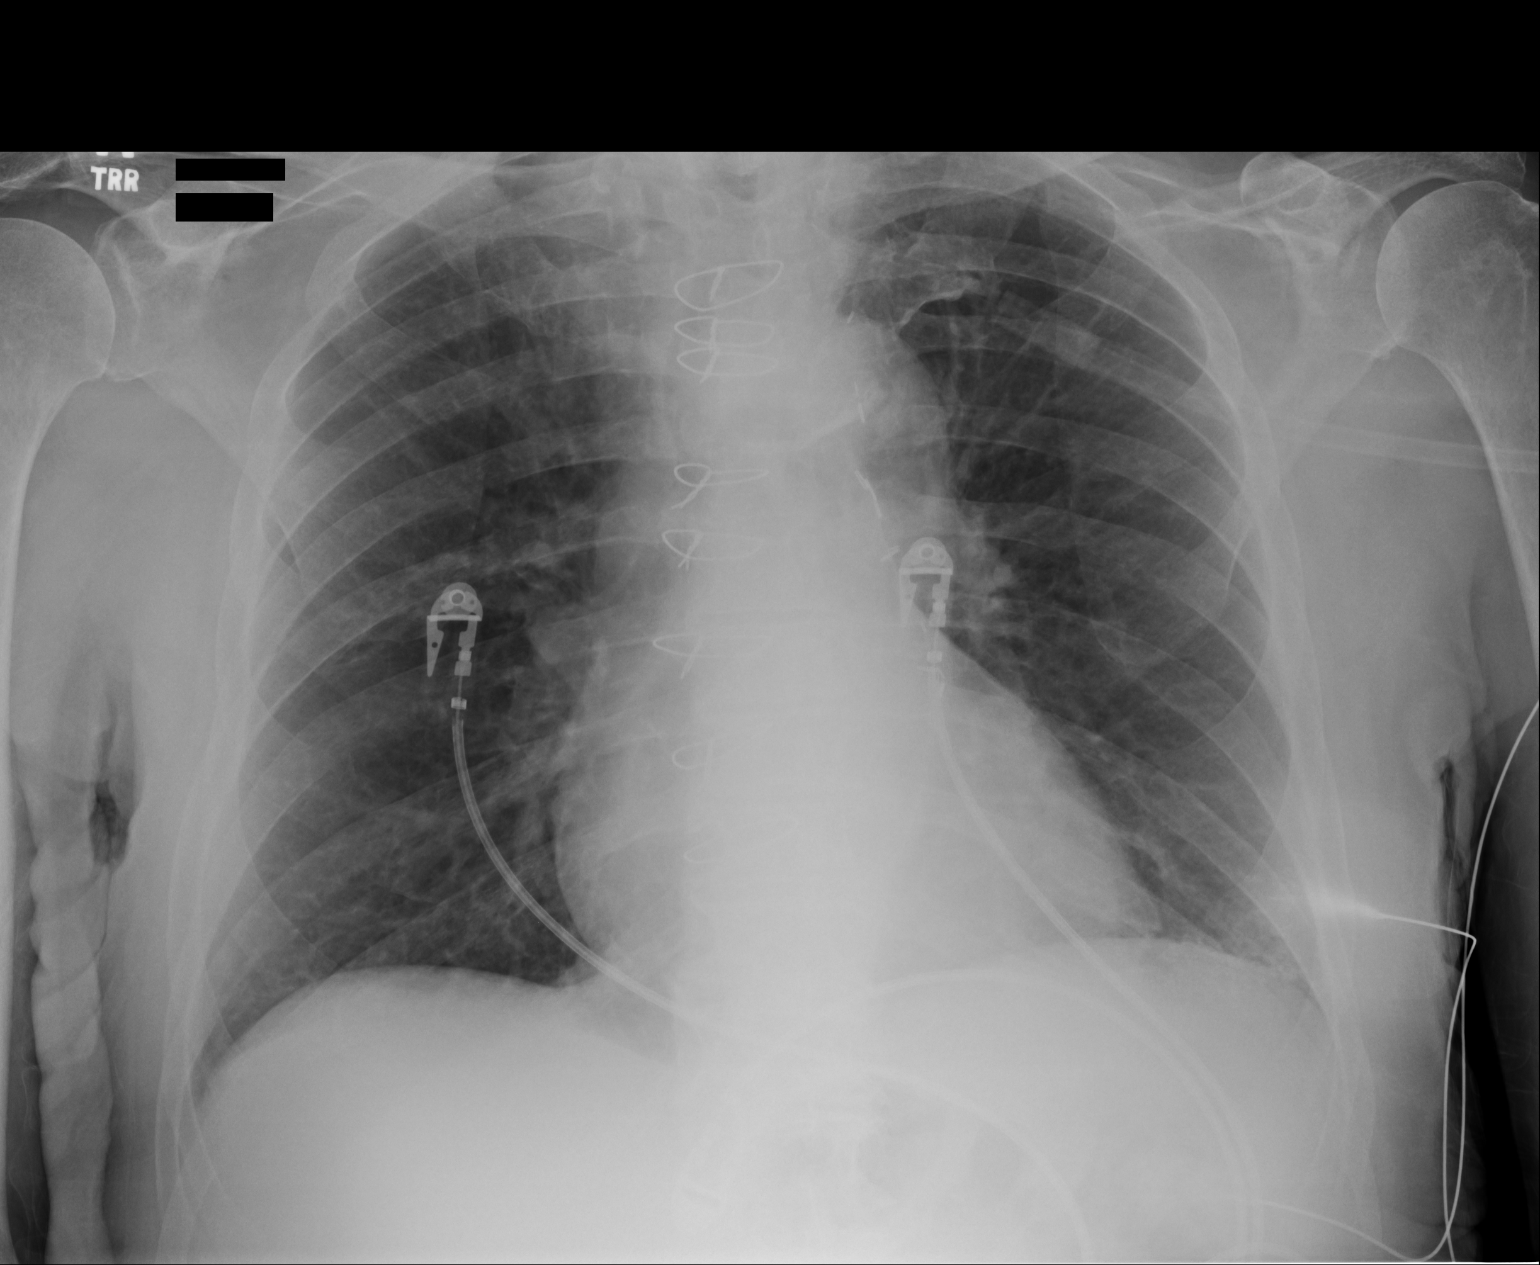

[1 of 1 positions shown; findings below may reference images not displayed]

FINDINGS: Single portable AP chest radiograph is provided.  There is no focal
parenchymal opacity, pleural effusion, or pneumothorax. Normal
cardiomediastinal silhouette. There is evidence of prior CABG. The osseous
structures are unremarkable.
IMPRESSION: No acute disease of the chest.

## 2014-10-02 NOTE — Discharge Summary (Signed)
PATIENT NAME:  Derrick Brooks, Derrick Brooks MR#:  [id number] DATE OF BIRTH:  [birth year]  DATE OF ADMISSION:  06/25/2011 DATE OF DISCHARGE:  06/27/2011  PRIMARY CARE PHYSICIAN: Duncan Dull, MD  REASON FOR ADMISSION: Seizures.  DISCHARGE DIAGNOSES:  1. Seizures.  2. Advanced dementia probable Alzheimer's, however, it is also listed on the chart that the patient has a history of vascular dementia.  3. Renal insufficiency with possible chronic kidney disease. 4. Urinary tract infection.  5. History of hypertension.  6. Hyperlipidemia failing diet control.  7. History of coronary artery disease status post coronary artery bypass graft. 8. Elevated troponin felt to be non-acute coronary syndrome related. Could be due to poor renal clearance.   CONSULTANT: Cristopher Peru, MD - Neurology.  DISCHARGE MEDICATIONS:  1. Keppra 250 mg p.o. every 12 hours.  2. Levaquin 500 mg p.o. daily x2 days.  3. Lipitor 10 mg p.o. daily.  4. Aspirin 81 mg daily.  5. Plavix 75 mg daily. 6. Quinapril 20 mg p.o. twice a day.  DISCHARGE DISPOSITION: Home.   DISCHARGE CONDITION: Improved, stable.   DISCHARGE ACTIVITY: As tolerated.   DISCHARGE DIET: Low sodium, low fat, low cholesterol.  DISCHARGE INSTRUCTIONS:  1. Take medications as prescribed.  2. Return to the emergency department for recurrence of symptoms or for any numbness, weakness, tingling, chest pain, shortness of breath, fever, chills, or for recurrence of seizures.  FOLLOWUP INSTRUCTIONS: 1. Followup with Dr. Duncan Dull within 1 to 2 weeks. 2. Followup with Dr. Julien Nordmann within one week.  PROCEDURES: Portable chest x-ray on admission: No acute cardiopulmonary abnormalities are noted.   Noncontrast head CT on admission: Findings consistent with diffuse atrophy and chronic basal ganglia lacunar infarcts. No acute intracranial abnormalities noted.   Brain MRI without contrast on 06/26/2011: No acute infarct. Evaluation is slightly limited by  patient motion artifact. Mild periventricular hypoattenuation likely sequela of chronic microangiopathy. Small lacunar infarcts in basal ganglia bilaterally.   EEG from 06/27/2011: Abnormal EEG due to generalized slowing of the background which is a nonspecific finding and can be seen in patients with dementia. There is no evidence of subclinical electrographic seizure activity on short recording. No epileptiform discharges were noted.   PERTINENT LABS/STUDIES: BUN 32 and creatinine 1.5 on  admission. BUN 22 and creatinine 1.37 on the day of discharge.   First and third sets of troponins were negative. The second set was minimally elevated at 0.07.   Total cholesterol 202, triglycerides 72, HDL 75, LDL 113.   CBC on admission: WBC 9.6, hemoglobin 11.5, hematocrit 35, platelets 212.   Urinalysis on admission: 1+ leukocyte esterase, 8 WBCs, trace bacteria.   BRIEF HISTORY/HOSPITAL COURSE: The patient is an 81 year old male with advanced dementia Alzheimer's versus vascular, history of hypertension, hyperlipidemia, and coronary artery disease status post coronary artery bypass graft who was brought to the ER secondary to seizures. Please see dictated admission history and physical for pertinent details surrounding the onset of this hospitalization. Please see below for further details.  1. Seizures - described by the patient's daughter as the patient having urinary incontinence and also some jerking movements and convulsion on multiple occasions. Initially the patient underwent head CT in the ER which was negative for acute intracranial abnormalities and revealed diffuse atrophy. Thereafter he was admitted to the medical floor for further evaluation and management. He was maintained on seizure precautions. He underwent EEG and MRI. MRI was negative for acute intracranial abnormalities. EEG revealed generalized slowing and there was no subclinical  electrographic seizure activity or any epileptiform  discharges noted on the EEG. Neurology consultation was obtained and Dr. Sherryll Burger recommended starting low dose Keppra for seizure prophylaxis. There were no witnessed seizures for the entirety of this hospitalization. In regards to patient's dementia, Dr. Sherryll Burger felt the patient had advanced Alzheimer's. He does have multiple vascular risk factors however and Dr. Sherryll Burger recommends continuation of aspirin and Plavix for now. Diffuse atrophy was noted on the CT which is more likely suggestive of Alzheimer's rather than multi-infarct/vascular dementia; however, the patient's dementia appears to be advanced at this stage. Per the patient's daughter, the patient did not tolerate Aricept well in the past.  2. Renal insufficiency - with possible CKD as the patient's renal function has been abnormal as an outpatient, per the patient's daughter, and I am not currently sure what the patient's baseline creatinine is or stage of his CKD and he will need close outpatient follow up with his primary care physician to keep a close eye on his renal function. I offered the patient followup with nephrology, but the patient's daughter was not interested and wishes to have this followed by the patient's primary care physician for now. Also, per the daughter, the patient was not drinking enough fluids and he may have had a prerenal component of his renal insufficiency and renal function is slightly improved with hydration and IV fluids. I encouraged and advised the patient to maintain adequate oral intake of solids and liquids as I did his daughter.  3. Urinary tract infection - based off abnormal urinalysis and urine culture was not collected at the time of admission, and the patient was maintained on Levaquin and has completed three out of five days of inpatient therapy and has two additional days of Levaquin therapy remaining at the time of discharge. He is afebrile and without leukocytosis and is nontoxic-appearing.  4. Hypertension -  the patient was maintained on quinapril. He did have some fluctuations and elevation of his blood pressure during this hospitalization, however, his daughter does not want him to receive any further antihypertensives and she was explained the risk of the patient developing myocardial infarction, cerebrovascular accident, and further vascular complications and vascular compromise including death with persistent uncontrolled hypertension. The daughter was in agreement with these risks and she still wished to avoid adding any further blood pressure medications for now. The patient will need close outpatient follow up with his primary care physician in regards to hypertension management and routine blood pressure checks.  5. Hyperlipidemia - failing diet control with LDL 113. The patient has coronary artery disease and is status post CABG with multiple vascular disease risk factors. We have started the patient on low dose statin as he appears to be failing diet control. I explained the risks of elevated transaminases with liver impairment as well as rhabdomyolysis as well as myositis with statin use, to the patient's daughter. The daughter voiced her understanding of these risks and was in agreement with the patient starting statin therapy for now.  6. History of coronary artery disease status post CABG - the patient is to continue aspirin and Plavix. He was noted to have a left bundle branch block on his EKG and was without any chest pain. I am not sure if his left bundle branch block is old or new. First set of troponin was negative and the second troponin was minimally elevated which is felt to be secondary to poor renal clearance rather than ACS. The patient denied any chest  pain. He does  however have underlying coronary artery disease and will continue aspirin, Plavix, statin, and ACE inhibitor therapy. The patient's daughter stated that he did not to tolerate beta blocker well in the past due to bradycardia  therefore one has not been started and the patient will followup with Dr. Mariah MillingGollan hopefully as an outpatient.   On 06/27/2011 the patient was hemodynamically stable and without any seizures and was felt to be stable for discharge home under the care of his daughter with close outpatient follow-up to which the patient's daughter was agreeable.   TIME SPENT ON DISCHARGE: Greater than 30 minutes.  ____________________________ Elon AlasKamran N. Broadus Costilla, MD knl:slb D: 06/30/2011 23:01:00 ET T: 07/01/2011 12:41:05 ET JOB#: [id number]  cc: Duncan Dulleresa Tullo, MD Elon AlasKamran N. Requan Hardge, MD, <Dictator> Antonieta Ibaimothy J. Gollan, MD Elon AlasKAMRAN N Omari Koslosky MD ELECTRONICALLY SIGNED 07/09/2011 18:52

## 2014-10-02 NOTE — Consult Note (Signed)
PATIENT NAME:  Derrick Brooks, Derrick Brooks MR#:  [id number] DATE OF BIRTH:  [birth year]  DATE OF CONSULTATION:  06/26/2011  REFERRING PHYSICIAN:  Camillo Flaming, MD  CONSULTING PHYSICIAN:  Hemang K. Sherryll Burger, MD  REASON FOR CONSULTATION: Possible seizures.   HISTORY OF PRESENT ILLNESS: Derrick Brooks is an 81 year old Caucasian gentleman with advanced dementia. He had first spell in the early part of December of 2012 when daughter had a hard time waking him up in the early morning when he was very limp and his eyes were rolling back and forth. When he woke up, he was himself (possible REM sleep).   He had a second episode on Friday morning on 06/20/2010 where the last time when daughter had seen him he was awake, alert, lying in the bed and the next time when the daughter went to see him he had peed on himself but there was no tongue bite. Daughter had no difficulty getting him up and out of the bed and giving him a bath.   The third spell happened on Monday, 06/24/2011, when patient was talking to the daughter and dropped his "toy truck" and didn't respond. His eyes rolled back. He turned very pale and he was flailing both his arms and legs which lasted for around a minute or so but then he was not postictal. He then wet himself.   He was brought to the hospital.   The patient does have a history of dementia which started around the age of 42 (around 2006/2007 time period) when he was diagnosed with diabetes. He had prediabetes for many years but was not treated. He also has hypertension, hyperlipidemia, and coronary artery disease. He smoked for more than 50 years.   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   He stopped taking care of his finances in 2008/2009 time period.    These days he needs help with preparing food, eating, bathing, and perineal hygiene, etc.   History was obtained by talking to the daughter who is his primary caregiver and review of the chart.    PAST MEDICAL HISTORY:  1. Dementia. 2. Hypertension. 3. Coronary artery disease. 4. Diabetes.   ALLERGIES: Significant for penicillin.   SOCIAL HISTORY: Significant that he used to be a smoker, quit many years ago. Does not drink alcohol and never had. He does not do recreational drugs. He lives at home with his wife and daughter. They used to live near [facility] but now they have moved to [place].   FAMILY HISTORY: Significant that his father died of heart disease. Mother died of natural causes.   MEDICATIONS: I reviewed his home medication list.   PHYSICAL EXAMINATION:   VITAL SIGNS: Temperature 97.9, pulse 52, respiratory rate 18, blood pressure 182/69, pulse oximetry 97%.   GENERAL: He was sleeping at baseline. He does not talk much. I was able to wake him up but he started fighting and pushing me away.   He does have strong palmar grasp. He does have positive palmomental reflex. He closes his eyes strongly and avoids any type of light but does not seem to have any neck stiffness.   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   He seemed to perceive a sensation of light touch and looked at me on both the sides. His pupils were reactive. He was mute.   Further neurological exam was difficult due to his very poor cognitive status.   LUNGS: Clear to auscultation.   HEART: S1,  S2 heart sounds.   He has multiple bumps on his scalp, likely lipid position per family.   REVIEW OF RADIOLOGICAL DATA: On his MRI of the brain, he does have significant motion artifact. He has some cortical atrophy. He has very minimal white matter changes.   ASSESSMENT AND PLAN: Frequent spells, three that the daughter described. The first spell looks and sounded like a REM sleep episode where the patient was having side to side eye movement and had generalized hypertonia and daughter had difficulty waking him up.   The second spell might have been a seizure and third spell daughter strongly felt like it  was a seizure. The only unusual part from my observation was his movements were asynchronous which is unlikely for seizure.   Daughter strongly believes that he had two spells of seizures and she has been "online researching" and she has a brother who is a doctor and feels like he should be on seizure medications.   I will review his EEG.   Will start him on levetiracetam 250 mg twice a day. I talked to the daughter about potential side effects.   Dementia, likely he has advanced Alzheimer's disease. He has been called vascular dementia but I did not see significant white matter disease on his suboptimal MRI of the brain.   He does not have a history of multiple infarcts by history.   He does have some basal ganglia white matter changes.   Daughter has planned his CODE STATUS and his Living Will, etc. He does not have access to firearms, etc. I talked to them about www.ALZ.org and other online resources for better management of Alzheimer's disease.   TIME SPENT: I spent more than 75 minutes with the daughter explaining and showing her brain scan. I also talked to Dr. Cherylann RatelLateef on the phone before seeing the patient.    Feel free to contact me with any further questions. I will follow this patient in the hospital on a less often basis.   ____________________________ Durene CalHemang K. Sherryll BurgerShah, MD hks:drc D: 06/26/2011 17:37:02 ET T: 06/27/2011 09:48:09 ET JOB#: [id number]  cc: Hemang K. Sherryll BurgerShah, MD, <Dictator> Durene CalHEMANG K [facility] MD ELECTRONICALLY SIGNED 07/05/2011 7:22

## 2014-10-02 NOTE — H&P (Signed)
PATIENT NAME:  Derrick Brooks, Derrick Brooks MR#:  [id number] DATE OF BIRTH:  03-07-1928  DATE OF ADMISSION:  06/24/2011  PRIMARY CARE PHYSICIAN:  Dr. Darrick Huntsman  CHIEF COMPLAINT:  New onset seizures.   HISTORY OF PRESENT ILLNESS: This is an 81 year old male brought into the hospital by EMS after a witnessed tonic-clonic seizure. The patient himself has underlying vascular dementia; therefore, most of the history is obtained from the daughter at bedside. As per the daughter, the patient was witnessed to have about a 30-second to one-minute tonic-clonic seizure this afternoon. She describes it as him holding a toy which he suddenly dropped. Then his eyes rolled backwards and he started to have vigorous arm and leg shaking that lasted about 30 seconds to a minute. The patient has never had this before. She called EMS and by the time EMS arrived he was alert and was talkative at that time, although at baseline he is usually not oriented. He was brought to the ER for further evaluation. He has not had any further episodes of seizures since then.  The hospitalist service was then contacted for further treatment and evaluation.   REVIEW OF SYSTEMS: Currently unobtainable given the patient's mental status.   PAST MEDICAL HISTORY:  1. Vascular dementia.  2. Hypertension.  3. History of coronary artery disease, status post coronary artery bypass graft.   ALLERGIES: Penicillin. The exact reaction to penicillin is unknown.   SOCIAL HISTORY: He used to be a smoker, quit many years ago. No alcohol abuse. No illicit drug abuse. Lives at home with his daughter and his wife.   FAMILY HISTORY: Father died from complications of heart disease. Mother died from natural causes.   CURRENT MEDICATIONS:  1. Quinapril 20 mg b.i.d.  2. Plavix 75 mg daily.  3. Aspirin 81 mg daily.  4. Vitamin B12 daily.  5. Vitamin C daily.  6. Calcium with vitamin D, 1 tab b.i.d.  7. Omega-3 fatty acids 1 tab daily.   PHYSICAL EXAMINATION ON  ADMISSION:  VITAL SIGNS: Temperature 98.8, pulse 57, respirations 22, blood pressure 172/68, sats 98% on room air.   GENERAL: He is a pleasant-appearing male in no apparent distress.   HEENT: Atraumatic, normocephalic. Extraocular muscles are intact. Pupils are equal and reactive to light. Sclerae anicteric. No conjunctival injection. No pharyngeal erythema.   NECK: Supple. No jugular venous distention, no bruits, no lymphadenopathy, no thyromegaly.   HEART: Regular rate and rhythm. No murmurs, rubs, or clicks.   LUNGS: Clear to auscultation bilaterally. No rales, no rhonchi, no wheezes.   ABDOMEN: Soft, flat, nontender, nondistended. Has good bowel sounds. No hepatosplenomegaly appreciated.   EXTREMITIES: No evidence of any cyanosis, clubbing, or peripheral edema. Has +2 pedal and radial pulses bilaterally.   NEUROLOGIC: The patient is alert, awake, and oriented times one. Moves all extremities spontaneously. No other focal motor or sensory deficits appreciated.  SKIN: Moist and warm with no rashes.   LYMPHATIC: There is no cervical or axillary lymphadenopathy.   LABORATORY, DIAGNOSTIC, AND RADIOLOGICAL DATA: Serum glucose 129, BUN 32, creatinine 1.5, sodium 146, potassium 4.2, chloride 110, bicarbonate 26. Liver function tests are within normal limits. Troponin less than 0.02. White cell count 9.6. Hemoglobin 11.5, hematocrit 35, platelet count of 212.   The patient did have a chest x-ray done which showed no evidence of any acute cardiopulmonary disease.   The patient did have a CT of the head done which showed findings consistent with diffuse atrophy and chronic basal ganglia lacunar infarcts. No  acute intracranial abnormality evident.   ASSESSMENT AND PLAN: This is an 81 year old male with history of vascular dementia, history of coronary artery disease status post coronary artery bypass graft, and hypertension who presents to the hospital with a witnessed tonic-clonic seizure.   1. New onset seizures: The patient is a high-risk candidate for having seizures given his underlying vascular dementia and previous strokes. This is his first seizure. I will not start him on any medications at this time as he has not had any recurrent episodes. His mental status is now back to baseline. I will place him on seizure precautions, observe him overnight, get a neurology consult in the morning.  2. Hypertension, presently hemodynamically stable: Continue his quinapril.  3. History of vascular dementia: Continue Plavix. Currently the patient is not taking a statin.  4. Chest pain: This is a very atypical chest pain and unlikely cardiac. I will observe him on telemetry. Check serial enzymes. Continue aspirin and Plavix.  5. CODE STATUS: The patient is a FULL CODE.   TIME SPENT: 50 minutes.   ____________________________ Rolly PancakeVivek J. Cherlynn KaiserSainani, MD vjs:bjt D: 06/24/2011 21:06:11 ET T: 06/25/2011 07:12:56 ET JOB#: [id number]  cc: Rolly PancakeVivek J. Cherlynn KaiserSainani, MD, <Dictator> Duncan Dulleresa Tullo, MD [place] SirenVIVEK J SAINANI MD ELECTRONICALLY SIGNED 06/28/2011 16:34

## 2014-10-02 NOTE — Discharge Summary (Signed)
PATIENT NAME:  Derrick Brooks, Derrick Brooks MR#:  [id number] DATE OF BIRTH:  21-Sep-1927  DATE OF ADMISSION:  06/28/2011 DATE OF DISCHARGE:  07/03/2011  CHIEF COMPLAINT: Chest pain, atrial fibrillation.   CONSULTANTS:  1. Dr. Jens Som and Dr. Mariah Milling from [facility]  2. Dr. Harriett Sine Phifer from Palliative Care    DISCHARGE DIAGNOSES:  1. Atrial fibrillation with RVR. 2. Coronary artery disease. 3. Seizures.  4. Hypernatremia.  5. Ischemic cardiomyopathy.  6. Heart failure with EF of 35 to 45%, systolic, likely chronic.   DISCHARGE MEDICATIONS:  1. Aspirin 81 mg daily.  2. Plavix 75 mg daily.  3. Quinapril 20 mg 2 times a day.  4. Keppra 250 mg every 12 hours p.o.  5. Lipitor 10 mg daily.  6. Fish Oil 1000 mg daily.  7. Vitamin B12 daily.  8. Calcium 600 Plus Vitamin D. 9. Vitamin C 250 mg daily.  10. Amiodarone 100 mg p.o. daily only if heart rate is greater than 55 and the patient is awake.   DIET: Low sodium, ADA diet.   ACTIVITY: As tolerated.   REFERRALS:  1. Physical therapy   2. Home RN  FOLLOW-UP:  1. Please follow-up with your PCP within one week and obtain a basic metabolic panel. 2. Please follow-up with Dr. Mariah Milling and Dr. Darrick Huntsman within 1 to 2 weeks.   HISTORY OF PRESENT ILLNESS: The patient is an 81 year old Caucasian male with history of significant vascular/Alzheimer's dementia who was recently admitted from January 14th to January 17th for presumed new onset seizures and was discharged on Keppra with history of hypertension, coronary artery disease who was brought in for chest pain and was found with atrial fibrillation with RVR. For full history and physical, please see the dictation done on January 18th by Dr. Margie Ege.   SIGNIFICANT LABS: Initial creatinine 1.45, BUN 24, glucose 142, peak sodium 148, sodium on discharge 146. Initial troponin 0.03, then 0.11, and then 0.07. WBC on arrival 9, hemoglobin 12. D-dimer elevated at 2.9. Urine cultures no growth to date.  Urinalysis not suggestive of urinary tract infection. Echocardiogram showing EF of 35 to 45% with evidence for LV relaxation impairment, septal motion is consistent with conduction abnormality, RV systolic pressure is normal, mild tricuspid regurgitation, RV systolic pressure is normal.   Ultrasound of the lower extremities does not show any evidence for DVT. X-ray of the chest, portable, no acute disease of the chest.  HOSPITAL COURSE: The patient was admitted to the medical service and started on albuterol as well as heparin drip. The patient was seen by Cardiology as well. The patient did convert to normal sinus rhythm. Ventricular response was slow. He was converted to p.o. The patient also did have elevated D-dimer, however, was not a good candidate for CT given renal function as well as V/Q scan given his dementia and combativeness. An echocardiogram was done to evaluate for right heart strain which was negative. Furthermore, ultrasound of the lower extremities was done which did not show any evidence for DVT. As he was started on heparin drip initially, that was eventually stopped. Currently the patient does have sinus rhythm, however, is bradycardic with rate in the 40's to 50's. Dr. Mariah Milling advised decreasing the amiodarone to 100 mg a only with a heart rate greater than 55 while the patient is awake. As of now, the amiodarone has been held as the heart rate has been in the high 40's. The patient had an echocardiogram showing EF of 35 to 45% and some  diastolic dysfunction as well. The aspirin and Plavix were continued and the patient's family was not interested in any aggressive measures. He is also a poor anticoagulation candidate given age and significant fall risk. His elevated troponin was likely demand ischemia from tachycardia and atrial fibrillation rather than true ACS. No aggressive measures were taken and the patient has no chest pain currently. The patient does have history of ischemic  cardiomyopathy with stenting in the past. Therefore, the Plavix and aspirin were continued and no beta-blocker was given the bradycardia. Initially the ACE inhibitor was held given mild acute renal insufficiency, however, that has been reinstated as the patient's power-of-attorney, his daughter, requested its continuation. The last creatinine from January 21st is 1.35 with GFR in the 50's. The blood pressure control has been somewhat suboptimal, however, this could be adjusted as an outpatient with addition of new medications. He will be discharged with home PT and RN. The patient's daughter who is the patient's POA lives next door and wife lives with the patient. At this point we would discharge the patient with outpatient follow-up with PCP for a BMP check within the next week and follow-up with Cardiology as well. The patient is LIMITED CODE, NO CPR, NO INTUBATION, however, vasoactive and pressors could be used as well as antibiotics.   TOTAL TIME SPENT: 35 minutes.  ____________________________ Krystal EatonShayiq Shanya Ferriss, MD sa:drc D: 07/03/2011 16:06:37 ET T: 07/04/2011 12:01:45 ET JOB#: [id number]  cc: Krystal EatonShayiq Hazelynn Mckenny, MD, <Dictator> Duncan Dulleresa Tullo, MD Krystal EatonSHAYIQ Nevin Kozuch MD ELECTRONICALLY SIGNED 07/09/2011 18:36

## 2014-10-02 NOTE — Consult Note (Signed)
General Aspect Derrick Brooks is an 81 year old gentleman with history of vascular dementia/Alzheimer's hypertension, coronary artery disease status post stents and CABG, and dyslipidemia recently discharged on 01/17 after diagnosis of seizure presenting with severe chest pain. Cardiology was consulted for chest pain and atrial fibrillation.   patient is unable to provide a hx. All details provided by the patients daughter. The patients daughter gave nitroglycerin sublingual x2,  and then she called EMS. When they arrived in the ER, he was still having chest pain. After treatment in the ED his symptoms began to subside. He has had previous epsiodes of chest pain that is typically relieved with NTG.    Present Illness . PAST MEDICAL HISTORY:  1. Vascular dementia/Alzheimer's.  2. Presumed new onset seizure disorder on admission 01/14 to 06/27/2011. He was discharged on Keppra for prophylaxis.  3. Hypertension.  4. History of coronary artery disease status post bypass in 1991 and status post three stents in 2009.   PAST SURGICAL HISTORY: As above.   SOCIAL HISTORY: former tobacco user. No alcohol or drug use. He lives at home with his wife who is also ill and his daughter stays with them.   FAMILY HISTORY: Heart disease. Father died from complications of heart disease. One brother has heart disease and another brother is status post bypass. One brother is on hemodialysis. There is family history of diabetes and mother died of sinus cancer.   Physical Exam:   GEN WD, WN, NAD    HEENT hearing intact to voice    NECK supple    RESP normal resp effort  clear BS    CARD Irregular rate and rhythm  Murmur    Murmur Systolic    ABD denies tenderness  soft    LYMPH negative neck    EXTR negative edema    SKIN normal to palpation    NEURO motor/sensory function intact    PSYCH poor insight, agitated   Review of Systems:   ROS Pt not able to provide ROS    Medications/Allergies  Reviewed Medications/Allergies reviewed        Admit Diagnosis:   CHEST PAIN DYSRHYTHMIA: 28-Jun-2011, Active, CHEST PAIN DYSRHYTHMIA      Admit Reason:   Cardiac dysrhythmia: (427.9) Active, ICD9, Unspecified cardiac dysrhythmia  Home Medications:  aspirin 81 mg oral tablet: 1 tab(s) orally once a day, Active  Plavix 75 mg oral tablet: 1 tab(s) orally once a day, Active  quinapril 20 mg oral tablet: tab(s) orally 2 times a day, Active  Keppra 250 mg oral tablet: 1 tab(s) orally every 12 hours, Active  Levaquin 500 mg oral tablet: 1 tab(s) orally X2 days, Active  Lipitor 10 mg oral tablet: 1 tab(s) orally once a day, Active  Fish Oil 1000 mg oral capsule: cap(s) orally once a day, Active  Vitamin B12:  orally , Active  Vitamin C:  orally , Active  Calcium 600+D:  orally , Active    Cardiac:  18-Jan-13 03:17    CK, Total 53   CPK-MB, Serum 1.1  Routine Hem:  18-Jan-13 03:17    WBC (CBC) 9.0   RBC (CBC) 3.91   Hemoglobin (CBC) 12.0   Hematocrit (CBC) 36.7   Platelet Count (CBC) 198   MCV 94   MCH 30.8   MCHC 32.9   RDW 13.8   Neutrophil % 63.1   Lymphocyte % 26.1   Monocyte % 8.0   Eosinophil % 2.5   Basophil % 0.3  Neutrophil # 5.7   Lymphocyte # 2.3   Monocyte # 0.7   Eosinophil # 0.2   Basophil # 0.0  Routine Chem:  18-Jan-13 03:17    Glucose, Serum 142   BUN 24   Creatinine (comp) 1.45   Sodium, Serum 144   Potassium, Serum 3.7   Chloride, Serum 105   CO2, Serum 24   Calcium (Total), Serum 9.2  Hepatic:  18-Jan-13 03:17    Bilirubin, Total 0.3   Alkaline Phosphatase 47   SGPT (ALT) 24   SGOT (AST) 21   Total Protein, Serum 5.9   Albumin, Serum 3.2  Routine Chem:  18-Jan-13 03:17    Osmolality (calc) 293   eGFR (African American) >60   eGFR (Non-African American) 50   Anion Gap 15  Routine Coag:  18-Jan-13 03:17    D-Dimer, Quantitative 2.90   Prothrombin 12.4   INR 0.9  Cardiac:  18-Jan-13 03:17    Troponin I 0.03  Routine Coag:   18-Jan-13 03:17    Activated PTT (APTT) 29.0  Thyroid:  18-Jan-13 03:17    Thyroid Stimulating Hormone 1.25  Cardiac:  18-Jan-13 17:59    CK, Total 45   CPK-MB, Serum 1.8   Troponin I 0.07  Routine UA:  18-Jan-13 20:32    Specific Gravity (UA) 1.011   EKG:   Interpretation EKG shows atrial fib  with LBBB, rate 116   Radiology Results: XRay:    18-Jan-13 03:23, Chest Portable Single View   Chest Portable Single View    REASON FOR EXAM:    chest pain  COMMENTS:       PROCEDURE: DXR - DXR PORTABLE CHEST SINGLE VIEW  - Jun 28 2011  3:23AM     RESULT: Comparison: None    Findings:     Single portable AP chest radiograph is provided.  There is no focal   parenchymal opacity, pleural effusion, or pneumothorax. Normal   cardiomediastinal silhouette. There is evidence of prior CABG. The   osseous structures are unremarkable.    IMPRESSION:   No acute disease of the chest.          Verified By: Jennette Banker, M.D., MD  Cardiology:    18-Jan-13 12:55, Echo Doppler   Echo Doppler    Interpretation Summary    Left ventricular systolic function is moderately reduced. Ejection   Fraction = 35-45%. The transmitral spectral Doppler flow pattern is   suggestive of impaired LV relaxation. Septal motion is consistent   with conductionabnormality. The right ventricular systolic function   is normal. There is mild tricuspid regurgitation. Right ventricular   systolic pressure is normal.    Procedure:    A two-dimensional transthoracic echocardiogram with color flow and   Doppler was performed.    Left Ventricle    The left ventricle is not well visualized.    There is mild concentric left ventricular hypertrophy.    Left ventricular systolic function is moderately reduced.    Ejection Fraction = 35-45%.    The transmitral spectral Doppler flow pattern is suggestive of   impaired LV relaxation.    Septal motion is consistent with conduction abnormality.    Right  Ventricle    The right ventricle is normal size.    The right ventricular systolic function is normal.    Atria  The left atrial size is normal.    Right atrial size is normal.    There is no Doppler evidence for an atrial septal  defect.    Mitral Valve    The mitral valve is grossly normal.    There is no mitral valve stenosis.    There is mild mitral regurgitation.    Tricuspid Valve    The tricuspid valve is not well visualized.    There is mild tricuspid regurgitation.    Right ventricular systolic pressure is normal.    Aortic Valve    The aortic valve is not well visualized.    No aortic regurgitationis present.    No hemodynamically significant valvular aortic stenosis.    Pulmonic Valve    The pulmonic valve is not well visualized.    There is no pulmonic valvular regurgitation.    Great Vessels    The aortic root is normal size.    The pulmonary artery is not well visualized, but is probably normal   size.    Pericardium/Pleural    There is no pleural effusion.    No pericardial effusion.    MMode 2D Measurements and Calculations    IVSd: 1.4 cm    LVIDd: 5.0 cm    LVIDs: 4.5 cm    LVPWd: 1.6 cm    FS: 10 %    EF(Teich): 22 %    Ao root diam: 3.4 cm    LA dimension: 3.6 cm    LVOT diam: 2.2 cm    Doppler Measurements and Calculations    MV E point: 73 cm/sec    MV A point: 93 cm/sec    MV E/A: 0.78     MV dec time: 0.30 sec    Ao V2 max: 130 cm/sec    Ao max PG: 7.0 mmHg    AVA(V,D): 2.4 cm2    LV max PG: 3.0 mmHg    LV V1 max: 82 cm/sec    PA V2 max: 92 cm/sec    PA max PG: 3.0 mmHg    TR Max vel: 220 cm/sec    TR Max PG: 19 mmHg    RVSP: 24 mmHg    RAP systole: 5.0 mmHg    Reading Physician: Ida Rogue   Sonographer: Sherrie Sport  Interpreting Physician:  Ida Rogue,  electronically signed on   06-28-2011 18:09:16  Requesting Physician: Ida Rogue    Penicillin: Rash, Itching  Vital Signs/Nurse's Notes: **Vital  Signs.:   18-Jan-13 07:15   Temperature Temperature (F) 97.8   Celsius 36.5   Pulse Pulse 106   Respirations Respirations 13   Systolic BP Systolic BP 650   Diastolic BP (mmHg) Diastolic BP (mmHg) 52   Mean BP 82   Pulse Ox % Pulse Ox % 100   Pulse Ox Heart Rate 48    10:07   Temperature Temperature (F) 97.8   Celsius 36.5   Pulse Pulse 96   Respirations Respirations 16   Systolic BP Systolic BP 354   Diastolic BP (mmHg) Diastolic BP (mmHg) 52   Mean BP 72   Pulse Ox % Pulse Ox % 100   Pulse Ox Heart Rate 64     Impression Derrick Brooks is an 81 year old gentleman with history of vascular dementia/Alzheimer's hypertension, coronary artery disease status post stents and CABG, and dyslipidemia recently discharged on 01/17 after diagnosis of seizure presenting with severe chest pain. Cardiology was consulted for chest pain and atrial fibrillation.   A Fib: 1)Would restart amio 1 mg/min Gentle fluids. Echo pending Family does not want aggressive care If he convertes to NSR, could change to amio po (possible  200 mg po BID) --May be unable to tolerate b-blockers secondary to bradycardia   2)Elevated d-dimer: LE ultrasound, could hold on V/Q given combative dementia ECHO to look at right heart strain if needed.  3) CAD: cardiac enz negative so far. Would manage medically  4) Dementia: advanced. Daughter takes care of patient.   Electronic Signatures: Ida Rogue (MD)  (Signed 18-Jan-13 22:54)  Authored: General Aspect/Present Illness, History and Physical Exam, Review of System, Past Medical History, Health Issues, Home Medications, Labs, EKG , Radiology, Allergies, Vital Signs/Nurse's Notes, Impression/Plan   Last Updated: 18-Jan-13 22:54 by Ida Rogue (MD)

## 2014-10-02 NOTE — Consult Note (Signed)
Chief Complaint:   Subjective/Chief Complaint Patient denies chest pain or shortness of breath; remains confused.   VITAL SIGNS/ANCILLARY NOTES: **Vital Signs.:   20-Jan-13 10:00   Vital Signs Type Q 4hr   Temperature Temperature (F) 97.5   Celsius 36.3   Temperature Source axillary   Pulse Pulse 46   Pulse source per Dinamap   Respirations Respirations 18   Systolic BP Systolic BP 144   Diastolic BP (mmHg) Diastolic BP (mmHg) 72   Mean BP 104   BP Source Dinamap   Pulse Ox % Pulse Ox % 98   Pulse Ox Activity Level  At rest   Oxygen Delivery Room Air/ 21 %   Brief Assessment:   Cardiac Regular  Bradycardic    Respiratory clear BS    Gastrointestinal Normal    Additional Physical Exam HEENT normal Patient with severe dementia ext no edema   Cardiac:  18-Jan-13 03:17    CK, Total 53   CPK-MB, Serum 1.1   Troponin I 0.03    10:18    CK, Total 49   CPK-MB, Serum 1.8   Troponin I 0.11    17:59    CK, Total 45   CPK-MB, Serum 1.8   Troponin I 0.07  Routine Chem:  20-Jan-13 04:05    Glucose, Serum 102   BUN 15   Creatinine (comp) 1.40   Sodium, Serum 148   Potassium, Serum 3.6   Chloride, Serum 112   CO2, Serum 25   Calcium (Total), Serum 8.4   Osmolality (calc) 295   eGFR (African American) >60   eGFR (Non-African American) 52   Anion Gap 11   Magnesium, Serum 1.7   Radiology Results: [place]:    18-Jan-13 16:05, [place] Color Flow Doppler Low Extrem Bilat   [place] Color Flow Doppler Low Extrem Bilat    REASON FOR EXAM:    elevated d-dimer, ?DVT  COMMENTS:       PROCEDURE: [place]  - US DOPPLER LOW EXTR BILATERAL  - Jun 28 2011  4:05PM     RESULT: Comparison: None    Findings: Multiple longitudinal and transverse gray-scale as well as   color and spectral Doppler images of  bilateral lower extremity veins   were obtained from the common femoral veins through the popliteal veins.    The right common femoral, greater saphenous, femoral, popliteal veins,   and  venous trifurcation are patent, demonstrating normal color-flow and   compressibility. No intraluminal thrombus is identified.There is normal   respiratory variation and augmentation demonstrated at all vein levels.   The left common femoral, greater saphenous, femoral, popliteal veins, and   venous trifurcation are patent, demonstrating normal color-flow and   compressibility. No intraluminal thrombus is identified.There is normal   respiratory variation and augmentation demonstrated at all vein levels.    IMPRESSION:      1. No evidence of DVT in the right lower extremity.  2. No evidence of DVT in the left lower extremity.          Verified By: Jennette Banker, M.D., MD  Cardiology:    18-Jan-13 12:55, Echo Doppler   Echo Doppler    Interpretation Summary    Left ventricular systolic function is moderately reduced. Ejection   Fraction = 35-45%. The transmitral spectral Doppler flow pattern is   suggestive of impaired LV relaxation. Septal motion is consistent   with conductionabnormality. The right ventricular systolic function   is normal. There is mild tricuspid regurgitation.  Right ventricular   systolic pressure is normal.    Procedure:    A two-dimensional transthoracic echocardiogram with color flow and   Doppler was performed.    Left Ventricle    The left ventricle is not well visualized.    There is mild concentric left ventricular hypertrophy.    Left ventricular systolic function is moderately reduced.    Ejection Fraction = 35-45%.    The transmitral spectral Doppler flow pattern is suggestive of   impaired LV relaxation.    Septal motion is consistent with conduction abnormality.    Right Ventricle    The right ventricle is normal size.    The right ventricular systolic function is normal.    Atria  The left atrial size is normal.    Right atrial size is normal.    There is no Doppler evidence for an atrial septal defect.    Mitral Valve    The mitral  valve is grossly normal.    There is no mitral valve stenosis.    There is mild mitral regurgitation.    Tricuspid Valve    The tricuspid valve is not well visualized.    There is mild tricuspid regurgitation.    Right ventricular systolic pressure is normal.    Aortic Valve    The aortic valve is not well visualized.    No aortic regurgitationis present.    No hemodynamically significant valvular aortic stenosis.    Pulmonic Valve    The pulmonic valve is not well visualized.    There is no pulmonic valvular regurgitation.    Great Vessels    The aortic root is normal size.    The pulmonary artery is not well visualized, but is probably normal   size.    Pericardium/Pleural    There is no pleural effusion.    No pericardial effusion.    MMode 2D Measurements and Calculations    IVSd: 1.4 cm    LVIDd: 5.0 cm    LVIDs: 4.5 cm    LVPWd: 1.6 cm    FS: 10 %    EF(Teich): 22 %    Ao root diam: 3.4 cm    LA dimension: 3.6 cm    LVOT diam: 2.2 cm    Doppler Measurements and Calculations    MV E point: 73 cm/sec    MV A point: 93 cm/sec    MV E/A: 0.78     MV dec time: 0.30 sec    Ao V2 max: 130 cm/sec    Ao max PG: 7.0 mmHg    AVA(V,D): 2.4 cm2    LV max PG: 3.0 mmHg    LV V1 max: 82 cm/sec    PA V2 max: 92 cm/sec    PA max PG: 3.0 mmHg    TR Max vel: 220 cm/sec    TR Max PG: 19 mmHg    RVSP: 24 mmHg    RAP systole: 5.0 mmHg    Reading Physician: Ida Rogue   Sonographer: Sherrie Sport  Interpreting Physician:  Ida Rogue,  electronically signed on   06-28-2011 18:09:16  Requesting Physician: Ida Rogue   Assessment/Plan:  Assessment/Plan:   Assessment 1) Atrial fibrillation - patient remains in sinus; would continue amiodarone at 200 mg po daily at DC. He will be at risk for worsening bradycardia but patient's family not interested in pacemaker. They understand potential consequences of bradycaria. Continue ASA and plavix. Not a candidate for  coumadin.  2) Chest pain  with minimal increase troponin - continue aspirin and plavix. Not a candidate for aggressive cardiac evaluation. 3) NCB and dementia 4) Renal insufficieny 5) ICM - Continue off  beta blocker given bradycardia 6) CAD - Continue ASA and plavix Goal per patient's family is comfort only   Electronic Signatures: Kirk Ruths (MD)  (Signed 20-Jan-13 12:54)  Authored: Chief Complaint, VITAL SIGNS/ANCILLARY NOTES, Brief Assessment, Lab Results, Radiology Results, Assessment/Plan   Last Updated: 20-Jan-13 12:54 by Kirk Ruths (MD)

## 2014-10-02 NOTE — Consult Note (Signed)
Chief Complaint:   Subjective/Chief Complaint Patient asleep; no chest pain this AM per his daughter   VITAL SIGNS/ANCILLARY NOTES: **Vital Signs.:   19-Jan-13 10:00   Vital Signs Type Routine   Pulse Pulse 48   Respirations Respirations 13   Systolic BP Systolic BP 902   Diastolic BP (mmHg) Diastolic BP (mmHg) 57   Mean BP 81   Pulse Ox % Pulse Ox % 97   Oxygen Delivery Room Air/ 21 %   Pulse Ox Heart Rate 48   Brief Assessment:   Cardiac Regular  Bradycardic    Respiratory clear BS  Periods of apnea    Gastrointestinal Normal    Additional Physical Exam Patient with severe dementia   Cardiac:  18-Jan-13 03:17    Troponin I 0.03    10:18    Troponin I 0.11    17:59    Troponin I 0.07  Routine Hem:  19-Jan-13 05:34    WBC (CBC) 8.4   RBC (CBC) 3.43   Hemoglobin (CBC) 10.6   Hematocrit (CBC) 32.1   Platelet Count (CBC) 188   MCV 94   MCH 31.0   MCHC 33.1   RDW 13.9   Neutrophil % 55.3   Lymphocyte % 32.8   Monocyte % 7.9   Eosinophil % 3.4   Basophil % 0.6   Neutrophil # 4.6   Lymphocyte # 2.7   Monocyte # 0.7   Eosinophil # 0.3   Basophil # 0.0  Routine Chem:  19-Jan-13 05:34    Glucose, Serum 104   BUN 20   Creatinine (comp) 1.46   Sodium, Serum 148   Potassium, Serum 3.6   Chloride, Serum 111   CO2, Serum 23   Calcium (Total), Serum 8.6   Osmolality (calc) 297   eGFR (African American) 60   eGFR (Non-African American) 49   Anion Gap 14  Routine Coag:  19-Jan-13 05:34    Activated PTT (APTT) 141.3  Routine Chem:  19-Jan-13 05:34    Magnesium, Serum 1.7   Radiology Results: XRay:    18-Jan-13 03:23, Chest Portable Single View   Chest Portable Single View    REASON FOR EXAM:    chest pain  COMMENTS:       PROCEDURE: DXR - DXR PORTABLE CHEST SINGLE VIEW  - Jun 28 2011  3:23AM     RESULT: Comparison: None    Findings:     Single portable AP chest radiograph is provided.  There is no focal   parenchymal opacity, pleural effusion,  or pneumothorax. Normal   cardiomediastinal silhouette. There is evidence of prior CABG. The   osseous structures are unremarkable.    IMPRESSION:   No acute disease of the chest.          Verified By: Jennette Banker, M.D., MD  [place]:    18-Jan-13 16:05, [place] Color Flow Doppler Low Extrem Bilat   [place] Color Flow Doppler Low Extrem Bilat    REASON FOR EXAM:    elevated d-dimer, ?DVT  COMMENTS:       PROCEDURE: [place]  - US DOPPLER LOW EXTR BILATERAL  - Jun 28 2011  4:05PM     RESULT: Comparison: None    Findings: Multiple longitudinal and transverse gray-scale as well as   color and spectral Doppler images of  bilateral lower extremity veins   were obtained from the common femoral veins through the popliteal veins.    The right common femoral, greater saphenous, femoral, popliteal veins,  and venous trifurcation are patent, demonstrating normal color-flow and   compressibility. No intraluminal thrombus is identified.There is normal   respiratory variation and augmentation demonstrated at all vein levels.   The left common femoral, greater saphenous, femoral, popliteal veins, and   venous trifurcation are patent, demonstrating normal color-flow and   compressibility. No intraluminal thrombus is identified.There is normal   respiratory variation and augmentation demonstrated at all vein levels.    IMPRESSION:      1. No evidence of DVT in the right lower extremity.  2. No evidence of DVT in the left lower extremity.          Verified By: Jennette Banker, M.D., MD  Cardiology:    18-Jan-13 12:55, Echo Doppler   Echo Doppler    Interpretation Summary    Left ventricular systolic function is moderately reduced. Ejection   Fraction = 35-45%. The transmitral spectral Doppler flow pattern is   suggestive of impaired LV relaxation. Septal motion is consistent   with conductionabnormality. The right ventricular systolic function   is normal. There is mild tricuspid regurgitation. Right  ventricular   systolic pressure is normal.    Procedure:    A two-dimensional transthoracic echocardiogram with color flow and   Doppler was performed.    Left Ventricle    The left ventricle is not well visualized.    There is mild concentric left ventricular hypertrophy.    Left ventricular systolic function is moderately reduced.    Ejection Fraction = 35-45%.    The transmitral spectral Doppler flow pattern is suggestive of   impaired LV relaxation.    Septal motion is consistent with conduction abnormality.    Right Ventricle    The right ventricle is normal size.    The right ventricular systolic function is normal.    Atria  The left atrial size is normal.    Right atrial size is normal.    There is no Doppler evidence for an atrial septal defect.    Mitral Valve    The mitral valve is grossly normal.    There is no mitral valve stenosis.    There is mild mitral regurgitation.    Tricuspid Valve    The tricuspid valve is not well visualized.    There is mild tricuspid regurgitation.    Right ventricular systolic pressure is normal.    Aortic Valve    The aortic valve is not well visualized.    No aortic regurgitationis present.    No hemodynamically significant valvular aortic stenosis.    Pulmonic Valve    The pulmonic valve is not well visualized.    There is no pulmonic valvular regurgitation.    Great Vessels    The aortic root is normal size.    The pulmonary artery is not well visualized, but is probably normal   size.    Pericardium/Pleural    There is no pleural effusion.    No pericardial effusion.    MMode 2D Measurements and Calculations    IVSd: 1.4 cm    LVIDd: 5.0 cm    LVIDs: 4.5 cm    LVPWd: 1.6 cm    FS: 10 %    EF(Teich): 22 %    Ao root diam: 3.4 cm    LA dimension: 3.6 cm    LVOT diam: 2.2 cm    Doppler Measurements and Calculations    MV E point: 73 cm/sec    MV A point: 93 cm/sec  MV E/A: 0.78     MV dec time: 0.30  sec    Ao V2 max: 130 cm/sec    Ao max PG: 7.0 mmHg    AVA(V,D): 2.4 cm2    LV max PG: 3.0 mmHg    LV V1 max: 82 cm/sec    PA V2 max: 92 cm/sec    PA max PG: 3.0 mmHg    TR Max vel: 220 cm/sec    TR Max PG: 19 mmHg    RVSP: 24 mmHg    RAP systole: 5.0 mmHg    Reading Physician: Ida Rogue   Sonographer: Sherrie Sport  Interpreting Physician:  Ida Rogue,  electronically signed on   06-28-2011 18:09:16  Requesting Physician: Ida Rogue   Assessment/Plan:  Assessment/Plan:   Assessment 1) Atrial fibrillation - patient back in sinus; would continue amiodarone at 200 mg po daily at DC. He will be at risk for worsening bradycardia but patient's family not interested in pacemaker. They understand potential consequences of bradycaria. Continue ASA and plavix. Not a candidate for coumadin. Patient can be transferred from cardiology standpoint (NCB) 2) Chest pain with minimal increase troponin - continue aspirin and plavix. Not a candidate for aggressive cardiac evaluation. 3) NCB and dementia 4) Renal insufficieny 5) ICM - Continue off ACEI; no beta blocker given bradycardia 6) CAD - Continue ASA and plavix Goal per patient's family is comfort only   Electronic Signatures: Kirk Ruths (MD)  (Signed 19-Jan-13 11:27)  Authored: Chief Complaint, VITAL SIGNS/ANCILLARY NOTES, Brief Assessment, Lab Results, Radiology Results, Assessment/Plan   Last Updated: 19-Jan-13 11:27 by Kirk Ruths (MD)

## 2014-10-02 NOTE — Consult Note (Signed)
Primary Care Physician:  Deborra Medina : [address], [place], [place] [address], [phone]  Past Medical/Surgical Hx:  HTN:   Alzheimer's Disease:   Home Medications:   aspirin 81 mg oral tablet: 1 tab(s) orally once a day, 16-Jan-13 12:13   Plavix 75 mg oral tablet: 1 tab(s) orally once a day, 16-Jan-13 12:13   quinapril 20 mg oral tablet: tab(s) orally 2 times a day, 16-Jan-13 12:13  Allergies:  Penicillin: Rash, Itching  Vital Signs: **Vital Signs.:   16-Jan-13 05:15   Vital Signs Type Recheck   Systolic BP Systolic BP 562   Diastolic BP (mmHg) Diastolic BP (mmHg) 68   Mean BP 98   Lab Results: Hepatic:  14-Jan-13 17:51    Bilirubin, Total 0.2   Alkaline Phosphatase   41   SGPT (ALT) 37   SGOT (AST) 26   Total Protein, Serum   6.2   Albumin, Serum 3.4  Routine Chem:  14-Jan-13 17:51    Ethanol, S. < 3   Ethanol % (comp) < 0.003  16-Jan-13 04:30    Cholesterol, Serum   202   Triglycerides, Serum 72   HDL (INHOUSE)   75   VLDL Cholesterol Calculated 14   LDL Cholesterol Calculated   113   Glucose, Serum 98   BUN   26   Creatinine (comp)   1.52   Sodium, Serum   146   Potassium, Serum 3.9   Chloride, Serum   108   CO2, Serum 27   Calcium (Total), Serum 8.5   Anion Gap 11   Osmolality (calc) 295   eGFR (African American)   57   eGFR (Non-African American)   47  Urine Drugs:  [id number]-LSL-37 34:28    Tricyclic Antidepressant, Ur Qual (comp) NEGATIVE   Amphetamines, Urine Qual. NEGATIVE   MDMA, Urine Qual. NEGATIVE   Cocaine Metabolite, Urine Qual. NEGATIVE   Opiate, Urine qual NEGATIVE   Phencyclidine, Urine Qual. NEGATIVE   Cannabinoid, Urine Qual. NEGATIVE   Barbiturates, Urine Qual. NEGATIVE   Benzodiazepine, Urine Qual. NEGATIVE   Methadone, Urine Qual. NEGATIVE  Cardiac:  15-Jan-13 13:44    Troponin I 0.04  Routine UA:  15-Jan-13 12:03    Color (UA) Yellow   Clarity (UA) Clear   Glucose (UA) Negative   Bilirubin  (UA) Negative   Ketones (UA) Negative   Specific Gravity (UA) 1.020   Blood (UA) Negative   pH (UA) 5.0   Protein (UA) 30 mg/dL   Nitrite (UA) Negative   Leukocyte Esterase (UA) 1+   RBC (UA) 1 /HPF   WBC (UA) 8 /HPF   Bacteria (UA) TRACE   Epithelial Cells (UA) <1 /HPF   Mucous (UA) PRESENT  Routine Hem:  14-Jan-13 17:51    WBC (CBC) 9.6   RBC (CBC)   3.74   Hemoglobin (CBC)   11.5   Hematocrit (CBC)   35.0   Platelet Count (CBC) 212   MCV 94   MCH 30.7   MCHC 32.8   RDW 14.2   Radiology Results: MRI:    16-Jan-13 13:45, MRI Brain Without Contrast   MRI Brain Without Contrast    REASON FOR EXAM:    seizure  COMMENTS:       PROCEDURE: MR  - MR BRAIN WO CONTRAST  - Jun 26 2011  1:45PM     RESULT: Comparison: CT of the brain 06/24/2011    Technique: Standard brain protocol, without intravenous contrast.  Findings:  Evaluation is limited by image degradation secondary to patient motion   artifact. Mild periventricular hypoattenuation is likely sequela of   chronic microangiopathy. Old small lacunar infarcts in the basal ganglia,   bilaterally. There are no areas of restricted diffusion to suggest acute   infarct. No evidence of mass, mass effect, midline shift, or extra-axial     fluid collection. The intracranial flow voids are unremarkable.    The visualized paranasal sinuses are well aerated.    IMPRESSION:   No acute infarct. Evaluation is slightly limited by patient motion   artifact.          Verified By: Gregor Hams, M.D., MD  CT:    14-Jan-13 18:05, CT Head Without Contrast   CT Head Without Contrast    REASON FOR EXAM:    seizure  COMMENTS:   May transport without cardiac monitor    PROCEDURE: CT  - CT HEAD WITHOUT CONTRAST  - Jun 24 2011  6:05PM     RESULT: Emergent noncontrast CT of the brain is performed. The patient   has no previous exam for comparison.    There is prominence of the ventricles and sulci consistent with diffuse    atrophy. There is no evidence of intracranial hemorrhage, mass or mass   effect. No large territorial infarct is evident. Bilateral basal ganglia   lacunar infarcts are present. The included sinuses and mastoid air cells   show normal appearing aeration with noted a plasia of the frontal sinuses   bilaterally. The calvarium is intact.    IMPRESSION:   1. Findings consistent with diffuse atrophy and chronic basal ganglia   lacunar infarcts. No acute intracranial abnormality evident by   noncontrast CT.          Verified By: Sundra Aland, M.D., MD   Impression/Recommendations:  Recommendations:   Please see my dictation for details. [id number] Spells - last one sounded like siezure but duaghter strongly feels he had 2 more spells of seizure. will reivew EEG. Showed MRI to daughter.levetiracetam 250 mg po bidDementia - advanced alzheimer's diasease, he has been called vascular dementia (he does have multiple vascular risk fators) on ASA and plavix.dementia counselling. [email or web address] etc spent more than  75     minutes with this patient and family. More than 50% of the time was spent in counseling and co-ordination of care. (answering daughter and other family members questions, showing neuro-imaging etc)free to contact me with any further questions on this pt, I will follow this patient on PRN basis.   Electronic Signatures: Ray Church (MD)  (Signed 16-Jan-13 17:43)  Authored: Primary Care Physician, PAST MEDICAL/SURGICAL HISTORY, HOME MEDICATIONS, ALLERGIES, NURSING VITAL SIGNS, LAB RESULTS, RADIOLOGY RESULTS, Recommendations   Last Updated: 16-Jan-13 17:43 by Ray Church (MD)

## 2014-10-02 NOTE — H&P (Signed)
PATIENT NAME:  Derrick Brooks, Derrick MR#:  [id number] DATE OF BIRTH:  Feb 25, 1928  DATE OF ADMISSION:  06/28/2011  REFERRING PHYSICIAN: Dr. Manson PasseyBrown   PRIMARY CARE PHYSICIAN: Derrick Dulleresa Tullo, MD  PRIMARY CARDIOLOGIST: Derrick Nordmannimothy Gollan, MD  PRESENTING COMPLAINT: Chest pain.   HISTORY OF PRESENT ILLNESS: Mr. Derrick BasemanStrickland is an 81 year old gentleman with history of vascular dementia/Alzheimer's hypertension, coronary artery disease status post stents and CABG, and dyslipidemia recently discharged on 01/17 for management of presumed new onset seizure. He returns via his daughter. The patient is demented at baseline. He is not answering questions. The daughter reports that he was in deep sleep when he woke up in severe chest pain grabbing his chest. She gave nitroglycerin sublingual without relief followed by a second nitroglycerin without relief, even 30 to 40 minutes later, and then she called EMS. When they arrived he was still having chest pain. After treatment in the ED his symptoms began to subside. She reports that with his usual symptoms of chest pain she is able to resolve his symptoms with a dose of nitroglycerin. The patient is otherwise unreliable with regards to his review of systems given his end-stage dementia.   PAST MEDICAL HISTORY:  1. Vascular dementia/Alzheimer's.  2. Presumed new onset seizure disorder on admission 01/14 to 06/27/2011. He was discharged on Keppra for prophylaxis.  3. Hypertension.  4. History of coronary artery disease status post bypass in 1991 and status post three stents in 2009.   PAST SURGICAL HISTORY: As above.   ALLERGIES: Penicillin.   SOCIAL HISTORY: He is a former tobacco user. No alcohol or drug use. He lives at home with his wife who is also ill and his daughter stays with them.   FAMILY HISTORY: Heart disease. Father died from complications of heart disease. One brother has heart disease and another brother is status post bypass. One brother is on hemodialysis.  There is family history of diabetes and mother died of sinus cancer.   MEDICATIONS:  1. Quinapril 20 mg twice a day. 2. Plavix 75 mg daily.  3. Aspirin 81 mg daily.  4. Vitamin B12 daily.  5. Vitamin C daily.  6. Calcium with vitamin D 1 tablet twice a day. 7. Omega-3 fatty acids 1 tablet daily.  8. Keppra 250 mg twice a day. 9. Levaquin 500 mg for two more days, from 06/27/2011.  10. Lipitor 10 mg daily.  REVIEW OF SYSTEMS: Unable to obtain due to the patient's baseline dementia and he is not cooperative in answering questions.   PHYSICAL EXAMINATION:   VITAL SIGNS: Temperature 97.1, pulse 101, respiratory rate 20, blood pressure 159/92, and saturating 96% on O2.   GENERAL: Lying in bed somnolent, in no apparent distress.   HEENT: Normocephalic, atraumatic. His pupils are equal, anicteric. Nasal cannula in place. He has moist mucous membranes.   NECK: Soft and supple. No adenopathy or JVP.   CARDIOVASCULAR: Irregular with intermittent tachycardia. No murmurs, rubs, or gallops.   LUNGS: Clear to auscultation anteriorly. No use of accessory muscles or increased respiratory effort.   ABDOMEN: Soft. Positive bowel sounds. No mass appreciated.   EXTREMITIES: Trace edema bilaterally. Dorsal pedis pulses intact.   MUSCULOSKELETAL: No joint effusion.   SKIN: No ulcers.   NEUROLOGIC: The patient is not cooperating with neurological exam.   PSYCH: He is moaning to verbal stimulation and sternal rub.   PERTINENT LABS/STUDIES: CK 53, MB 1.1. Troponin 0.03. PTT 29. INR 0.9. D-dimer 2.9. WBC 9, hemoglobin 12, hematocrit 36.7, platelets 198, MCV  94. Glucose 142, BUN 24, creatinine 1.45, sodium 144, potassium 3.7, chloride 105, carbon dioxide 24, calcium 9.2, total bilirubin 0.3, alkaline phosphatase 47, ALT 24, AST 21.  Chest x-ray: Mild congestion. No infiltrate or effusion. Unchanged from his previous chest x-ray.  EKG: Ventricular tachycardia and EKG strips with atrial  fibrillation. No ST elevation. He has a left bundle branch block.   ASSESSMENT AND PLAN: [name] is an 81 year old gentleman with history of coronary artery disease status post stents and bypass, history of hyperlipidemia, hypertension, probable chronic kidney disease and presumed new onset seizure representing less than 24 hours later with chest pain.  1. V. tach/atrial fibrillation with RVR: He was initiated on amiodarone in the ED with a drop in his blood pressure and systolic blood pressure in the 50s. He has not improved with bolus so the patient will be started on Levophed. His first cardiac enzymes are negative. We will continue to cycle. We will get echocardiogram and hold off on further diagnostics until evaluated by cardiology. His daughter is his POA and does not want to be aggressive with management. He is a DO NOT RESUSCITATE. We will restart aspirin, Plavix, and statin and start on a heparin drip. Admit the patient to the Critical Care Unit for close monitoring. Hold his quinapril. Continue with maintenance IV fluids. We will send TSH and magnesium level and repeat his urinalysis and urine culture when possible.  2. New onset seizure: He was recommended on discharge to continue his Keppra. We will restart. 3. Renal insufficiency: His creatinine is stable. We will continue as above with fluids and holding his quinapril.  4. Urinary tract infection: He was discharged on Levaquin. We will continue on lower dose for two more days based on his renal clearance. As above repeat his urinalysis and urine culture when able to.  5. Elevated d-dimer: We will get a V/Q scan when stable. For now he has been started on a heparin drip from a cardiac standpoint.  6. Prophylaxis: Heparin drip, aspirin, Plavix and Protonix.  7. We will obtain a palliative care consultation. The patient's prognosis is guarded and high risk for cardiac respiratory event.   TIME SPENT: Approximately 50 minutes was spent on  critical care. ____________________________ Derrick Derby, MD ap:slb D: 06/28/2011 05:17:30 ET T: 06/28/2011 07:30:59 ET JOB#: [id number]  cc: Pearlean Brownie Terrance Usery, MD, <Dictator> Derrick Dull, MD Derrick Derby MD ELECTRONICALLY SIGNED 06/29/2011 22:26
